# Patient Record
Sex: Female | Born: 1990 | Race: White | Hispanic: No | Marital: Married | Smoking: Never smoker
Health system: Southern US, Community
[De-identification: ages and names within clinical notes are randomized; demographics above are authoritative.]

## PROBLEM LIST (undated history)

## (undated) DIAGNOSIS — K3184 Gastroparesis: Secondary | ICD-10-CM

## (undated) DIAGNOSIS — Z9889 Other specified postprocedural states: Secondary | ICD-10-CM

## (undated) HISTORY — PX: ANTERIOR COMPARTMENT DECOMPRESSION: SHX547

---

## 2014-11-02 DIAGNOSIS — R6881 Early satiety: Secondary | ICD-10-CM | POA: Insufficient documentation

## 2016-01-06 DIAGNOSIS — Z8349 Family history of other endocrine, nutritional and metabolic diseases: Secondary | ICD-10-CM | POA: Insufficient documentation

## 2016-01-06 DIAGNOSIS — K30 Functional dyspepsia: Secondary | ICD-10-CM | POA: Insufficient documentation

## 2016-06-25 ENCOUNTER — Ambulatory Visit
Admission: EM | Admit: 2016-06-25 | Discharge: 2016-06-25 | Disposition: A | Discharge status: Home / Self Care | Payer: BC Managed Care – PPO | Attending: Family Medicine | Admitting: Family Medicine

## 2016-06-25 DIAGNOSIS — R21 Rash and other nonspecific skin eruption: Secondary | ICD-10-CM

## 2016-06-25 HISTORY — DX: Gastroparesis: K31.84

## 2016-06-25 HISTORY — DX: Other specified postprocedural states: Z98.890

## 2016-06-25 MED ORDER — PREDNISONE 10 MG PO TABS: ORAL_TABLET | ORAL | 0 refills | Status: DC

## 2016-06-25 NOTE — Discharge Instructions (Signed)
Take medication as prescribed. Rest. Drink plenty of fluids. Take benadryl at home at discussed.   Follow up with your primary care physician or dermatology this week as needed. Return to Urgent care for new or worsening concerns.

## 2016-06-25 NOTE — ED Triage Notes (Signed)
Patient complains of rash that started yesterday. Patient states that rash originally starte yesterday on her trunk and has now spread to her arms. Patient reports that rash is itchy.

## 2016-06-25 NOTE — ED Provider Notes (Signed)
[facility] ____________________________________________  Time seen: Approximately 4:41 PM  I have reviewed the triage vital signs and the nursing notes.   HISTORY  Chief Complaint Rash  HPI Helen Black is a 25 y.o. female presents for complaints of rash. Reports woke up with small rash area to anterior abdomen yesterday, that has since spread. States rash has spread to torso yesterday, and today has started to spread to tops of arms and legs. Denies known trigger. Denies changes in foods, medications, detergents, lotions or contacts. States no recent sickness, cough, congestion or fevers. States rash is itchy. Denies pain. Denies taking any over the counter medications for the same. Denies other accompanying symptoms. Denies recent insect or tick bites.   PCP: Cleon DewWILLIAMS, MAGGIE ANNA CAMILLE, MD Patient's last menstrual period was 05/14/2016.Reports on 3 month pack oral contraceptive. Denies pregnancy.     Past Medical History:  Diagnosis Date  . Gastroparesis   . History of fasciotomy     There are no active problems to display for this patient.   Past Surgical History:  Procedure Laterality Date  . ANTERIOR COMPARTMENT DECOMPRESSION      Current Outpatient Rx  . Order #: [id number] Class: Historical Med  . Order #: [id number] Class: Normal    No current facility-administered medications for this encounter.   Current Outpatient Prescriptions:  .  norethindrone-ethinyl estradiol-iron (ESTROSTEP FE,TILIA FE,TRI-LEGEST FE) 1-20/1-30/1-35 MG-MCG tablet, Take 1 tablet by mouth daily., Disp: , Rfl:  .  predniSONE (DELTASONE) 10 MG tablet, Start 60 mg po day one, then 50 mg po day two, taper by 10 mg daily until complete., Disp: 21 tablet, Rfl: 0  Allergies Review of patient's allergies indicates no known allergies.  History reviewed. No pertinent family history.  Social History Social History  Substance Use Topics  . Smoking status: Never Smoker  .  Smokeless tobacco: Never Used  . Alcohol use Yes     Comment: occasionally    Review of Systems Constitutional: No fever/chills Eyes: No visual changes. ENT: No sore throat. Cardiovascular: Denies chest pain. Respiratory: Denies shortness of breath. Gastrointestinal: No abdominal pain.  No nausea, no vomiting.  No diarrhea.  No constipation. Genitourinary: Negative for dysuria. Musculoskeletal: Negative for back pain. Skin: Positive for rash. Neurological: Negative for headaches, focal weakness or numbness.  10-point ROS otherwise negative.  ____________________________________________   PHYSICAL EXAM:  VITAL SIGNS: ED Triage Vitals  Enc Vitals Group     BP 06/25/16 1624 115/79     Pulse Rate 06/25/16 1624 97     Resp 06/25/16 1624 16     Temp 06/25/16 1624 98.7 F (37.1 C)     Temp Source 06/25/16 1624 Oral     SpO2 06/25/16 1624 100 %     Weight 06/25/16 1622 125 lb (56.7 kg)     Height 06/25/16 1622 5\' 7"  (1.702 m)     Head Circumference --      Peak Flow --      Pain Score 06/25/16 1623 3     Pain Loc --      Pain Edu? --      Excl. in GC? --     Constitutional: Alert and oriented. Well appearing and in no acute distress. Eyes: Conjunctivae are normal. PERRL. EOMI. ENT      Head: Normocephalic and atraumatic.      Nose: No congestion/rhinnorhea.      Mouth/Throat: Mucous membranes are moist.Oropharynx non-erythematous. Neck: No stridor. Supple without meningismus.  Hematological/Lymphatic/Immunilogical: No cervical  lymphadenopathy. Cardiovascular: Normal rate, regular rhythm. Grossly normal heart sounds.  Good peripheral circulation. Respiratory: Normal respiratory effort without tachypnea nor retractions. Breath sounds are clear and equal bilaterally. No wheezes/rales/rhonchi.. Gastrointestinal: Soft and nontender.  Musculoskeletal:  Nontender with normal range of motion in all extremities.  Neurologic:  Normal speech and language. No gross focal  neurologic deficits are appreciated. Speech is normal. No gait instability.  Skin:  Skin is warm, dry and intact. Except: pruritis erythematous scattered blanchable rash to torso mainly with some rash extending to proximal bilateral upper and lower extremities, no rash to hands or feet or face, nontender, no exudate, no induration, rash in christmas tree distribution bilaterally, no heralds patch noted.  Psychiatric: Mood and affect are normal. Speech and behavior are normal. Patient exhibits appropriate insight and judgment   ___________________________________________   LABS (all labs ordered are listed, but only abnormal results are displayed)  Labs Reviewed - No data to display ____________________________________________   PROCEDURES Procedures    INITIAL IMPRESSION / ASSESSMENT AND PLAN / ED COURSE  Pertinent labs & imaging results that were available during my care of the patient were reviewed by me and considered in my medical decision making (see chart for details).  Well appearing patient. No acute distress. Presents for rash x two days. Patient expresses concern of possibility that she came in contact with something, triggering a rash, but denies known triggers. Rash appearance and distribution appears consistent with pityriasis rosea. Suspect patient may have started with heralds patch to abdomen. As patient concerned for contact trigger, will treat with oral prednisone taper, supportive care, prn benadryl for itching. Discussed regarding pityriasis and supportive care. Discussed indication, risks and benefits of medications with patient.  Discussed follow up with Primary care physician this week. Discussed follow up and return parameters including no resolution or any worsening concerns. Patient verbalized understanding and agreed to plan.   ____________________________________________   FINAL CLINICAL IMPRESSION(S) / ED DIAGNOSES  Final diagnoses:  Rash      Discharge Medication List as of 06/25/2016  5:11 PM    START taking these medications   Details  predniSONE (DELTASONE) 10 MG tablet Start 60 mg po day one, then 50 mg po day two, taper by 10 mg daily until complete., Normal        Note: This dictation was prepared with Dragon dictation along with smaller phrase technology. Any transcriptional errors that result from this process are unintentional.    Clinical Course      Renford Dills, NP 06/26/16 [phone]

## 2016-06-27 ENCOUNTER — Telehealth: Payer: Self-pay

## 2016-06-27 NOTE — Telephone Encounter (Signed)
Courtesy call back completed today for patients recent visit at [facility]. Patient did not answer, left message on voicemail to call back with any questions or concerns.   

## 2018-01-10 LAB — HM PAP SMEAR: HM Pap smear: NEGATIVE

## 2018-02-27 ENCOUNTER — Ambulatory Visit (INDEPENDENT_AMBULATORY_CARE_PROVIDER_SITE_OTHER): Payer: BC Managed Care – PPO | Admitting: Obstetrics and Gynecology

## 2018-02-27 ENCOUNTER — Encounter: Payer: Self-pay | Admitting: Obstetrics and Gynecology

## 2018-02-27 VITALS — BP 122/76 | Ht 67.0 in | Wt 127.0 lb

## 2018-02-27 DIAGNOSIS — N970 Female infertility associated with anovulation: Secondary | ICD-10-CM | POA: Insufficient documentation

## 2018-02-27 DIAGNOSIS — N912 Amenorrhea, unspecified: Secondary | ICD-10-CM

## 2018-02-27 LAB — POCT URINE PREGNANCY: Preg Test, Ur: NEGATIVE

## 2018-02-27 MED ORDER — MEDROXYPROGESTERONE ACETATE 10 MG PO TABS: 10.0000 mg | ORAL_TABLET | Freq: Every day | ORAL | 0 refills | Status: DC

## 2018-02-27 NOTE — Progress Notes (Signed)
Obstetrics & Gynecology Office Visit   Chief Complaint  Patient presents with  . Amenorrhea    Stopped OCP in 06/2017, no cycle, negative pregnancy test   History of Present Illness: 27 y.o. G0P0000 female who had been on contraception for three years. She stopped taking the birth control in October. She had no menses since that time.  She did take a 10-day course of provera at the beginning of May and did have a five-day withdrawal bleed from that. She was on Microgestin 1.5/30.  Prior to being on contraception she had a menses each month, lasting about 7-8 days.  She had not been on contraception prior to 2015. She started having withdrawal bleeding on 01/17/18.  She took a test every day for weeks starting at about day 8 of her cycle.  She is now actively trying to conceive and is taking PNVs.     Last pap smear was in early May and it was normal.  Denies history of STDs.   She has a history of gastroparesis.  She saw a nutritionist and changed her diet.  This was an unknown cause.  She initially dropped weight.  She is back to nearly her original weight.  Gastroparesis was diagnosed about 2 years ago.  It seems to be under good control nutritionally.   She does not exercise like she did in the past. She walks every day.   She had a workup in February. Normal FSH, Prolactin, estradiol, normal thyroid studies in January. She has had multiple negative pregnancy tests, including today.   Past Medical History:  Diagnosis Date  . Gastroparesis   . History of fasciotomy     Past Surgical History:  Procedure Laterality Date  . ANTERIOR COMPARTMENT DECOMPRESSION      Gynecologic History: Patient's last menstrual period was 07/01/2017 (approximate).  Obstetric History: G0P0000  Family History: Sister with thyroid disease. No gynecologic cancer.     Social History   Socioeconomic History  . Marital status: Married    Spouse name: Not on file  . Number of children: Not on file  .  Years of education: Not on file  . Highest education level: Not on file  Occupational History  . Not on file  Social Needs  . Financial resource strain: Not on file  . Food insecurity:    Worry: Not on file    Inability: Not on file  . Transportation needs:    Medical: Not on file    Non-medical: Not on file  Tobacco Use  . Smoking status: Never Smoker  . Smokeless tobacco: Never Used  Substance and Sexual Activity  . Alcohol use: Yes    Comment: occasionally  . Drug use: No  . Sexual activity: Yes  Lifestyle  . Physical activity:    Days per week: Not on file    Minutes per session: Not on file  . Stress: Not on file  Relationships  . Social connections:    Talks on phone: Not on file    Gets together: Not on file    Attends religious service: Not on file    Active member of club or organization: Not on file    Attends meetings of clubs or organizations: Not on file    Relationship status: Not on file  . Intimate partner violence:    Fear of current or ex partner: Not on file    Emotionally abused: Not on file    Physically abused: Not on file  Forced sexual activity: Not on file  Other Topics Concern  . Not on file  Social History Narrative  . Not on file   Allergies: No Known Allergies  Prior to Admission medications   PNV   Review of Systems  Constitutional: Negative.   HENT: Negative.   Eyes: Negative.   Respiratory: Negative.   Cardiovascular: Negative.   Gastrointestinal: Negative.   Genitourinary: Negative.   Musculoskeletal: Negative.   Skin: Negative.   Neurological: Negative.   Psychiatric/Behavioral: Negative.      Physical Exam BP 122/76 (BP Location: Left Arm, Patient Position: Sitting, Cuff Size: Normal)   Ht 5\' 7"  (1.702 m)   Wt 127 lb (57.6 kg)   LMP 07/01/2017 (Approximate)   BMI 19.89 kg/m  Patient's last menstrual period was 07/01/2017 (approximate). Physical Exam  Constitutional: She is oriented to person, place, and time.  She appears well-developed and well-nourished. No distress.  HENT:  Head: Normocephalic and atraumatic.  Eyes: EOM are normal. No scleral icterus.  Neck: Normal range of motion. Neck supple. No thyromegaly present.  Cardiovascular: Normal rate and regular rhythm. Exam reveals no gallop and no friction rub.  No murmur heard. Pulmonary/Chest: Effort normal and breath sounds normal. No respiratory distress. She has no wheezes. She has no rales.  Abdominal: Soft. Bowel sounds are normal. She exhibits no distension and no mass. There is no tenderness. There is no rebound and no guarding.  Musculoskeletal: Normal range of motion. She exhibits no edema.  Lymphadenopathy:    She has no cervical adenopathy.  Neurological: She is alert and oriented to person, place, and time. No cranial nerve deficit.  Skin: Skin is warm and dry. No erythema.  Psychiatric: She has a normal mood and affect. Her behavior is normal. Judgment normal.   Lab Results  Component Value Date   PREGTESTUR Negative 02/27/2018   Assessment: 27 y.o. 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 Amenorrhea      Plan: Problem List Items Addressed This Visit      Genitourinary   Female infertility associated with anovulation - Primary   Relevant Medications   medroxyPROGESTERone (PROVERA) 10 MG tablet     Other   Amenorrhea   Relevant Medications   medroxyPROGESTERone (PROVERA) 10 MG tablet   Other Relevant Orders   POCT urine pregnancy (Completed)     Based on her workup of secondary amenorrhea, she appears to be anovulatory.  Given that [facility] early cycle, through mid-cycle, into late cycle, she likely tested during the window in which she would have ovulated and had [facility]. We discussed anovulation and its causes. She has no laboratory indicators of hyperandrogenemia.  Discussed ovulation induction, given her desire to conceive.  Discussed clomid versus  letrozole.  Discussed the risks and benefits of each. She would like to try letrozole to see if we can induce ovulation.  Will check day-21 progesterone levels.    30 minutes spent in face to face discussion with > 50% spent in counseling,management, and coordination of care of her anovulatory cycles and infertility.   Thomasene MohairStephen Jalien Weakland, MD 02/27/2018 5:37 PM

## 2018-03-03 ENCOUNTER — Encounter: Payer: Self-pay | Admitting: Obstetrics and Gynecology

## 2018-03-03 NOTE — Telephone Encounter (Signed)
This is your patient.

## 2018-03-13 ENCOUNTER — Encounter: Payer: Self-pay | Admitting: Obstetrics and Gynecology

## 2018-03-15 ENCOUNTER — Encounter: Payer: Self-pay | Admitting: Obstetrics and Gynecology

## 2018-03-17 ENCOUNTER — Other Ambulatory Visit: Payer: Self-pay | Admitting: Obstetrics and Gynecology

## 2018-03-17 DIAGNOSIS — N970 Female infertility associated with anovulation: Secondary | ICD-10-CM

## 2018-03-17 MED ORDER — LETROZOLE 2.5 MG PO TABS: 5.0000 mg | ORAL_TABLET | Freq: Every day | ORAL | 0 refills | Status: DC

## 2018-04-07 ENCOUNTER — Other Ambulatory Visit: Payer: BC Managed Care – PPO

## 2018-04-07 DIAGNOSIS — N970 Female infertility associated with anovulation: Secondary | ICD-10-CM

## 2018-04-08 ENCOUNTER — Encounter: Payer: Self-pay | Admitting: Obstetrics and Gynecology

## 2018-04-08 LAB — PROGESTERONE: PROGESTERONE: 27.8 ng/mL

## 2018-04-13 ENCOUNTER — Encounter: Payer: Self-pay | Admitting: Obstetrics and Gynecology

## 2018-04-14 ENCOUNTER — Other Ambulatory Visit: Payer: Self-pay | Admitting: Obstetrics and Gynecology

## 2018-04-14 DIAGNOSIS — N970 Female infertility associated with anovulation: Secondary | ICD-10-CM

## 2018-04-14 MED ORDER — LETROZOLE 2.5 MG PO TABS: 5.0000 mg | ORAL_TABLET | Freq: Every day | ORAL | 0 refills | Status: AC

## 2018-04-14 NOTE — Telephone Encounter (Signed)
Called in repeat letrozole not sure what cycle number this is for her

## 2018-04-21 ENCOUNTER — Other Ambulatory Visit: Payer: Self-pay | Admitting: Obstetrics and Gynecology

## 2018-04-21 DIAGNOSIS — N970 Female infertility associated with anovulation: Secondary | ICD-10-CM

## 2018-05-06 ENCOUNTER — Other Ambulatory Visit: Payer: BC Managed Care – PPO

## 2018-05-06 DIAGNOSIS — N970 Female infertility associated with anovulation: Secondary | ICD-10-CM

## 2018-05-07 LAB — PROGESTERONE: Progesterone: 23.3 ng/mL

## 2018-05-15 ENCOUNTER — Other Ambulatory Visit: Payer: Self-pay | Admitting: Obstetrics and Gynecology

## 2018-05-15 DIAGNOSIS — N970 Female infertility associated with anovulation: Secondary | ICD-10-CM

## 2018-05-15 MED ORDER — LETROZOLE 2.5 MG PO TABS: 5.0000 mg | ORAL_TABLET | Freq: Every day | ORAL | 0 refills | Status: AC

## 2018-06-19 ENCOUNTER — Other Ambulatory Visit: Payer: Self-pay | Admitting: Obstetrics and Gynecology

## 2018-06-19 DIAGNOSIS — N970 Female infertility associated with anovulation: Secondary | ICD-10-CM

## 2018-06-19 MED ORDER — LETROZOLE 2.5 MG PO TABS: 7.5000 mg | ORAL_TABLET | Freq: Every day | ORAL | 0 refills | Status: AC

## 2018-07-28 ENCOUNTER — Other Ambulatory Visit (HOSPITAL_COMMUNITY)
Admission: RE | Admit: 2018-07-28 | Discharge: 2018-07-28 | Disposition: A | Discharge status: Home / Self Care | Payer: BC Managed Care – PPO | Source: Ambulatory Visit | Attending: Obstetrics and Gynecology | Admitting: Obstetrics and Gynecology

## 2018-07-28 ENCOUNTER — Encounter: Payer: Self-pay | Admitting: Obstetrics and Gynecology

## 2018-07-28 ENCOUNTER — Ambulatory Visit (INDEPENDENT_AMBULATORY_CARE_PROVIDER_SITE_OTHER): Payer: BC Managed Care – PPO | Admitting: Obstetrics and Gynecology

## 2018-07-28 VITALS — BP 114/70 | Wt 125.0 lb

## 2018-07-28 DIAGNOSIS — Z113 Encounter for screening for infections with a predominantly sexual mode of transmission: Secondary | ICD-10-CM | POA: Insufficient documentation

## 2018-07-28 DIAGNOSIS — Z3401 Encounter for supervision of normal first pregnancy, first trimester: Secondary | ICD-10-CM

## 2018-07-28 DIAGNOSIS — Z3A01 Less than 8 weeks gestation of pregnancy: Secondary | ICD-10-CM

## 2018-07-28 DIAGNOSIS — Z3400 Encounter for supervision of normal first pregnancy, unspecified trimester: Secondary | ICD-10-CM | POA: Insufficient documentation

## 2018-07-28 NOTE — Progress Notes (Signed)
New Obstetric Patient H&P   Chief Complaint: "Desires prenatal care"  History of Present Illness: Patient is a 27 y.o. G1P0000 Not Hispanic or Latino female, LMP 06/19/2018 presents with amenorrhea and positive home pregnancy test. Based on her  LMP, her EDD is Estimated Date of Delivery: 03/26/19 and her EGA is [redacted]w[redacted]d. Cycles are 7. days, regular, and occur approximately every : 33 days. Her last pap smear was 7 months ago and was no abnormalities.    She had a urine pregnancy test which was positive 10 day(s)  ago. Her last menstrual period was normal and lasted for  6 or 7 day(s). Since her LMP she claims she has experienced no issues. She denies vaginal bleeding. Her past medical history is notable for gastroparesis.   Since her LMP, she admits to the use of tobacco products  no She claims she has gained zero pounds since the start of her pregnancy.  There are cats in the home in the home  no  She admits close contact with children on a regular basis  yes  She has had chicken pox in the past yes She has had Tuberculosis exposures, symptoms, or previously tested positive for TB   no Current or past history of domestic violence. no  Genetic Screening/Teratology Counseling: (Includes patient, baby's father, or anyone in either family with:)   1. Patient's age >/= 33 at [facility]  no 2. Thalassemia ([place], [place], Mediterranean, or Asian background): MCV<80  no 3. Neural tube defect (meningomyelocele, spina bifida, anencephaly)  no 4. Congenital heart defect  no  5. Down syndrome  no 6. Tay-Sachs (Jewish, [place]  no 7. Canavan's Disease  no 8. Sickle cell disease or trait (African)  no  9. Hemophilia or other blood disorders  no  10. Muscular dystrophy  no  11. Cystic fibrosis  no  12. Huntington's Chorea  no  13. Mental retardation/autism  no 14. Other inherited genetic or chromosomal disorder  no 15. Maternal metabolic disorder (DM, PKU, etc)  no 16. Patient or FOB with a child  with a birth defect not listed above no  16a. Patient or FOB with a birth defect themselves no 17. Recurrent pregnancy loss, or stillbirth  no  18. Any medications since LMP other than prenatal vitamins (include vitamins, supplements, OTC meds, drugs, alcohol)  Letrozole, miralax, PNV 19. Any other genetic/environmental exposure to discuss  no  Infection History:   1. Lives with someone with TB or TB exposed  no  2. Patient or partner has history of genital herpes  no 3. Rash or viral illness since LMP  no 4. History of STI (GC, CT, HPV, syphilis, HIV)  no 5. History of recent travel :  no  Other pertinent information:  History of gastroparesis.  Pregnancy conceived on letrozole    Review of Systems:10 point review of systems negative unless otherwise noted in HPI  Past Medical History:  Diagnosis Date  . Gastroparesis   . History of fasciotomy     Past Surgical History:  Procedure Laterality Date  . ANTERIOR COMPARTMENT DECOMPRESSION      Gynecologic History: Patient's last menstrual period was 06/19/2018.  Obstetric History: G1P0000  Family History  Problem Relation Age of Onset  . Hypothyroidism Sister     Social History   Socioeconomic History  . Marital status: Married    Spouse name: Not on file  . Number of children: Not on file  . Years of education: Not on file  .  Highest education level: Not on file  Occupational History  . Not on file  Social Needs  . Financial resource strain: Not on file  . Food insecurity:    Worry: Not on file    Inability: Not on file  . Transportation needs:    Medical: Not on file    Non-medical: Not on file  Tobacco Use  . Smoking status: Never Smoker  . Smokeless tobacco: Never Used  Substance and Sexual Activity  . Alcohol use: Yes    Comment: occasionally  . Drug use: No  . Sexual activity: Yes  Lifestyle  . Physical activity:    Days per week: Not on file    Minutes per session: Not on file  . Stress: Not on  file  Relationships  . Social connections:    Talks on phone: Not on file    Gets together: Not on file    Attends religious service: Not on file    Active member of club or organization: Not on file    Attends meetings of clubs or organizations: Not on file    Relationship status: Not on file  . Intimate partner violence:    Fear of current or ex partner: Not on file    Emotionally abused: Not on file    Physically abused: Not on file    Forced sexual activity: Not on file  Other Topics Concern  . Not on file  Social History Narrative  . Not on file    No Known Allergies  Prior to Admission medications   Medication Sig Start Date End Date Taking? Authorizing Provider  PRENATAL 28-0.8 MG TABS Take by mouth.   Yes [provider]  medroxyPROGESTERone (PROVERA) 10 MG tablet Take 1 tablet (10 mg total) by mouth daily for 10 days. 02/27/18 03/09/18  Conard Novak, MD    Physical Exam BP 114/70   Wt 125 lb (56.7 kg)   LMP 06/19/2018   BMI 19.58 kg/m   Physical Exam  Constitutional: She is oriented to person, place, and time. She appears well-developed and well-nourished. No distress.  HENT:  Head: Normocephalic and atraumatic.  Eyes: Conjunctivae are normal. No scleral icterus.  Neck: Normal range of motion. Neck supple. No thyromegaly present.  Cardiovascular: Normal rate and regular rhythm. Exam reveals no gallop and no friction rub.  No murmur heard. Pulmonary/Chest: Effort normal and breath sounds normal. No respiratory distress. She has no wheezes. She has no rales.  Abdominal: Soft. Bowel sounds are normal. She exhibits no distension and no mass. There is no tenderness. There is no rebound and no guarding.  Genitourinary: Vagina normal. Pelvic exam was performed with patient supine. There is no rash, tenderness, lesion or injury on the right labia. There is no rash, tenderness, lesion or injury on the left labia. Uterus is enlarged. Uterus is not deviated, not  fixed and not tender. Cervix exhibits no motion tenderness, no discharge and no friability. Right adnexum displays no mass, no tenderness and no fullness. Left adnexum displays no mass, no tenderness and no fullness. No erythema or bleeding in the vagina. No signs of injury around the vagina. No vaginal discharge found.  Musculoskeletal: Normal range of motion. She exhibits no edema.  Lymphadenopathy:    She has no cervical adenopathy.  Neurological: She is alert and oriented to person, place, and time. No cranial nerve deficit.  Skin: Skin is warm and dry. No erythema.  Psychiatric: She has a normal mood and affect. Her  behavior is normal. Judgment normal.     Female Chaperone present during breast and/or pelvic exam.  Assessment: 27 y.o. G1P0000 at [redacted]w[redacted]d presenting to initiate prenatal care  Plan: 1) Avoid alcoholic beverages. 2) Patient encouraged not to smoke.  3) Discontinue the use of all non-medicinal drugs and chemicals.  4) Take prenatal vitamins daily.  5) Nutrition, food safety (fish, cheese advisories, and high nitrite foods) and exercise discussed. 6) Hospital and practice style discussed with cross coverage system.  7) Genetic Screening, such as with 1st Trimester Screening, cell free fetal DNA, AFP testing, and Ultrasound, as well as with amniocentesis and CVS as appropriate, is discussed with patient. At the conclusion of today's visit patient undecided genetic testing 8) Patient is asked about travel to areas at risk for the [place] virus, and counseled to avoid travel and exposure to mosquitoes or sexual partners who may have themselves been exposed to the virus. Testing is discussed, and will be ordered as appropriate.   Thomasene Mohair, MD 07/28/2018 10:34 AM

## 2018-07-29 LAB — RPR+RH+ABO+RUB AB+AB SCR+CB...
Antibody Screen: NEGATIVE
HEMOGLOBIN: 15 g/dL (ref 11.1–15.9)
HIV SCREEN 4TH GENERATION: NONREACTIVE
Hematocrit: 43.9 % (ref 34.0–46.6)
Hepatitis B Surface Ag: NEGATIVE
MCH: 30.1 pg (ref 26.6–33.0)
MCHC: 34.2 g/dL (ref 31.5–35.7)
MCV: 88 fL (ref 79–97)
PLATELETS: 303 10*3/uL (ref 150–450)
RBC: 4.99 x10E6/uL (ref 3.77–5.28)
RDW: 12.4 % (ref 12.3–15.4)
RPR Ser Ql: NONREACTIVE
RUBELLA: 4.59 {index} (ref >0.99)
Rh Factor: POSITIVE
VARICELLA: 2133 {index} (ref >165)
WBC: 4.9 10*3/uL (ref 3.4–10.8)

## 2018-07-29 LAB — URINE DRUG PANEL 7
Amphetamines, Urine: NEGATIVE ng/mL
BENZODIAZEPINE QUANT UR: NEGATIVE ng/mL
Barbiturate Quant, Ur: NEGATIVE ng/mL
CANNABINOID QUANT UR: NEGATIVE ng/mL
COCAINE (METAB.): NEGATIVE ng/mL
Opiate Quant, Ur: NEGATIVE ng/mL
PCP Quant, Ur: NEGATIVE ng/mL

## 2018-07-29 LAB — BETA HCG QUANT (REF LAB): HCG QUANT: 3822 m[IU]/mL

## 2018-07-30 LAB — URINE CULTURE

## 2018-07-30 LAB — CERVICOVAGINAL ANCILLARY ONLY
Chlamydia: NEGATIVE
Neisseria Gonorrhea: NEGATIVE

## 2018-08-12 ENCOUNTER — Ambulatory Visit (INDEPENDENT_AMBULATORY_CARE_PROVIDER_SITE_OTHER): Payer: BC Managed Care – PPO

## 2018-08-12 ENCOUNTER — Ambulatory Visit (INDEPENDENT_AMBULATORY_CARE_PROVIDER_SITE_OTHER): Payer: BC Managed Care – PPO | Admitting: Obstetrics and Gynecology

## 2018-08-12 ENCOUNTER — Encounter: Payer: Self-pay | Admitting: Obstetrics and Gynecology

## 2018-08-12 VITALS — BP 106/62 | Wt 124.0 lb

## 2018-08-12 DIAGNOSIS — N8311 Corpus luteum cyst of right ovary: Secondary | ICD-10-CM | POA: Diagnosis not present

## 2018-08-12 DIAGNOSIS — Z3401 Encounter for supervision of normal first pregnancy, first trimester: Secondary | ICD-10-CM

## 2018-08-12 DIAGNOSIS — Z3A01 Less than 8 weeks gestation of pregnancy: Secondary | ICD-10-CM | POA: Diagnosis not present

## 2018-08-12 DIAGNOSIS — O3411 Maternal care for benign tumor of corpus uteri, first trimester: Secondary | ICD-10-CM | POA: Diagnosis not present

## 2018-08-12 DIAGNOSIS — O219 Vomiting of pregnancy, unspecified: Secondary | ICD-10-CM

## 2018-08-12 LAB — POCT URINALYSIS DIPSTICK OB: GLUCOSE, UA: NEGATIVE

## 2018-08-12 MED ORDER — DOXYLAMINE-PYRIDOXINE ER 20-20 MG PO TBCR
1.0000 | EXTENDED_RELEASE_TABLET | Freq: Two times a day (BID) | ORAL | 11 refills | Status: AC
Start: 2018-08-12 — End: 2018-10-11

## 2018-08-12 NOTE — Progress Notes (Signed)
ROB/US C/O nausea

## 2018-08-12 NOTE — Progress Notes (Signed)
Routine Prenatal Care Visit  Subjective  Helen Black is a 27 y.o. G1P0000 at [redacted]w[redacted]d being seen today for ongoing prenatal care.  She is currently monitored for the following issues for this low-risk pregnancy and has Female infertility associated with anovulation; Amenorrhea; Delayed gastric emptying; Early satiety; Family history of thyroid disease in sister; and Supervision of normal first pregnancy on their problem list.  ----------------------------------------------------------------------------------- Patient reports nausea.    . Vag. Bleeding: None.   . Denies leaking of fluid.  ----------------------------------------------------------------------------------- The following portions of the patient's history were reviewed and updated as appropriate: allergies, current medications, past family history, past medical history, past social history, past surgical history and problem list. Problem list updated.   Objective  Blood pressure 106/62, weight 124 lb (56.2 kg), last menstrual period 06/19/2018. Pregravid weight 125 lb (56.7 kg) Total Weight Gain -1 lb (-0.454 kg) Urinalysis:      Fetal Status: Fetal Heart Rate (bpm): 141         General:  Alert, oriented and cooperative. Patient is in no acute distress.  Skin: Skin is warm and dry. No rash noted.   Cardiovascular: Normal heart rate noted  Respiratory: Normal respiratory effort, no problems with respiration noted  Abdomen: Soft, gravid, appropriate for gestational age. Pain/Pressure: Absent     Pelvic:  Cervical exam deferred        Extremities: Normal range of motion.     Mental Status: Normal mood and affect. Normal behavior. Normal judgment and thought content.     Assessment   27 y.o. G1P0000 at [redacted]w[redacted]d by  03/30/2019, by Ultrasound presenting for routine prenatal visit  Plan   pregnancy Problems (from 07/28/18 to present)    Problem Noted Resolved   Supervision of normal first pregnancy 07/28/2018 by Conard Novak, MD No   Overview Addendum 08/12/2018  5:20 PM by Natale Milch, MD      Clinic [facility] Prenatal Labs  Dating  7 wk [place] Blood type: B/Positive/-- (11/11 1127)   Genetic Screen 1 Screen:     AFP:      Quad:      NIPS:    Antibody:Negative (11/11 1127)  Anatomic [place]  Rubella: 4.59 (11/11 1127)  Varicella: Immune  GTT Early:        28 wk:      RPR: Non Reactive (11/11 1127)   Rhogam  not needed HBsAg: Negative (11/11 1127)   TDaP vaccine                       HIV: Non Reactive (11/11 1127)   Flu Shot                                GBS:   Contraception  Pap: 2019 NIL  CBB     CS/VBAC    Baby Food    Support Person                 Gestational age appropriate obstetric precautions including but not limited to vaginal bleeding, contractions, leaking of fluid and fetal movement were reviewed in detail with the patient.    Prescription for bonjesta sent. Discussed B6 and unisom as an alternative. Discussed small frequent meals. Discussed BRAT diet. Discussed ginger.   Will date pregnancy off of [place]. Patient reports ovulation and conception on 07/07/18. She reports 35 day cycle length usually.   Discussed  options for genetic screening for the pregnancy with Maternit21 vs First trimester screening.    Return in about 3 weeks (around 09/02/2018) for ROB.  Natale Milchhristanna R Lisle Skillman MD [facility], [facility] 08/12/2018, 5:22 PM

## 2018-09-05 ENCOUNTER — Encounter: Payer: Self-pay | Admitting: Obstetrics and Gynecology

## 2018-09-05 ENCOUNTER — Ambulatory Visit (INDEPENDENT_AMBULATORY_CARE_PROVIDER_SITE_OTHER): Payer: BC Managed Care – PPO | Admitting: Obstetrics and Gynecology

## 2018-09-05 VITALS — BP 114/70 | Wt 127.0 lb

## 2018-09-05 DIAGNOSIS — Z3401 Encounter for supervision of normal first pregnancy, first trimester: Secondary | ICD-10-CM

## 2018-09-05 DIAGNOSIS — Z1379 Encounter for other screening for genetic and chromosomal anomalies: Secondary | ICD-10-CM

## 2018-09-05 DIAGNOSIS — Z3A10 10 weeks gestation of pregnancy: Secondary | ICD-10-CM

## 2018-09-05 NOTE — Progress Notes (Signed)
Routine Prenatal Care Visit  Subjective  Helen Black is a 27 y.o. G1P0000 at 6042w4d being seen today for ongoing prenatal care.  She is currently monitored for the following issues for this low-risk pregnancy and has Female infertility associated with anovulation; Amenorrhea; Delayed gastric emptying; Early satiety; Family history of thyroid disease in sister; and Supervision of normal first pregnancy on their problem list.  ----------------------------------------------------------------------------------- Patient reports no complaints.    . Vag. Bleeding: None.   . Denies leaking of fluid.  ----------------------------------------------------------------------------------- The following portions of the patient's history were reviewed and updated as appropriate: allergies, current medications, past family history, past medical history, past social history, past surgical history and problem list. Problem list updated.   Objective  Blood pressure 114/70, weight 127 lb (57.6 kg), last menstrual period 06/19/2018. Pregravid weight 125 lb (56.7 kg) Total Weight Gain 2 lb (0.907 kg) Urinalysis: Urine Protein    Urine Glucose    Fetal Status: Fetal Heart Rate (bpm): 170         General:  Alert, oriented and cooperative. Patient is in no acute distress.  Skin: Skin is warm and dry. No rash noted.   Cardiovascular: Normal heart rate noted  Respiratory: Normal respiratory effort, no problems with respiration noted  Abdomen: Soft, gravid, appropriate for gestational age. Pain/Pressure: Absent     Pelvic:  Cervical exam deferred        Extremities: Normal range of motion.     Mental Status: Normal mood and affect. Normal behavior. Normal judgment and thought content.   Assessment   27 y.o. G1P0000 at 1042w4d by  03/30/2019, by Ultrasound presenting for routine prenatal visit  Plan   pregnancy Problems (from 07/28/18 to present)    Problem Noted Resolved   Supervision of normal first  pregnancy 07/28/2018 by Conard NovakJackson, Stephen D, MD No   Overview Addendum 08/12/2018  5:20 PM by Natale MilchSchuman, Christanna R, MD      Clinic [facility] Prenatal Labs  Dating  7 wk US Blood type: B/Positive/-- (11/11 1127)   Genetic Screen 1 Screen:     AFP:      Quad:      NIPS:    Antibody:Negative (11/11 1127)  Anatomic US  Rubella: 4.59 (11/11 1127)  Varicella: Immune  GTT Early:        28 wk:      RPR: Non Reactive (11/11 1127)   Rhogam  not needed HBsAg: Negative (11/11 1127)   TDaP vaccine                       HIV: Non Reactive (11/11 1127)   Flu Shot                                GBS:   Contraception  Pap: 2019 NIL  CBB     CS/VBAC    Baby Food    Support Person                 Preterm labor symptoms and general obstetric precautions including but not limited to vaginal bleeding, contractions, leaking of fluid and fetal movement were reviewed in detail with the patient. Please refer to After Visit Summary for other counseling recommendations.   Return in about 1 week (around 09/12/2018) for U/S for NT and labs(NT, carrier screening), routine prenatal.  Thomasene MohairStephen Jackson, MD, Merlinda FrederickFACOG [facility], [facility] 09/05/2018 4:33 PM

## 2018-09-15 ENCOUNTER — Ambulatory Visit (INDEPENDENT_AMBULATORY_CARE_PROVIDER_SITE_OTHER): Payer: BC Managed Care – PPO | Admitting: Obstetrics & Gynecology

## 2018-09-15 ENCOUNTER — Ambulatory Visit (INDEPENDENT_AMBULATORY_CARE_PROVIDER_SITE_OTHER): Payer: BC Managed Care – PPO

## 2018-09-15 VITALS — BP 90/60 | Wt 123.0 lb

## 2018-09-15 DIAGNOSIS — Z3A12 12 weeks gestation of pregnancy: Secondary | ICD-10-CM

## 2018-09-15 DIAGNOSIS — Z3682 Encounter for antenatal screening for nuchal translucency: Secondary | ICD-10-CM

## 2018-09-15 DIAGNOSIS — Z3401 Encounter for supervision of normal first pregnancy, first trimester: Secondary | ICD-10-CM

## 2018-09-15 DIAGNOSIS — Z1379 Encounter for other screening for genetic and chromosomal anomalies: Secondary | ICD-10-CM

## 2018-09-15 LAB — POCT URINALYSIS DIPSTICK OB
GLUCOSE, UA: NEGATIVE
POC,PROTEIN,UA: NEGATIVE

## 2018-09-15 NOTE — Patient Instructions (Signed)

## 2018-09-15 NOTE — Addendum Note (Signed)
Addended by: Cornelius MorasPATTERSON, Chey Cho D on: 09/15/2018 02:14 PM   Modules accepted: Orders

## 2018-09-15 NOTE — Progress Notes (Signed)
  HPI: Min nausea, no pain.  Pt is [redacted] weeks pregnant  Ultrasound demonstrates - viable IUP, singleton, 12 weeks, see below These findings are also for NT  PMHx: She  has a past medical history of Gastroparesis and History of fasciotomy. Also,  has a past surgical history that includes Anterior compartment decompression., family history includes Hypothyroidism in her sister.,  reports that she has never smoked. She has never used smokeless tobacco. She reports current alcohol use. She reports that she does not use drugs.  She has a current medication list which includes the following prescription(s): doxylamine-pyridoxine er and prenatal. Also, has No Known Allergies.  Review of Systems  All other systems reviewed and are negative.   Objective: BP 90/60   Wt 123 lb (55.8 kg)   LMP 06/19/2018 (Approximate)   BMI 19.26 kg/m   Physical examination Constitutional NAD, Conversant  Skin No rashes, lesions or ulceration.   Extremities: Moves all appropriately.  Normal ROM for age. No lymphadenopathy.  Neuro: Grossly intact  Psych: Oriented to PPT.  Normal mood. Normal affect.   Us Fetal Nuchal Translucency Measurement  Result Date: 09/15/2018 Patient Name: Helen Black DOB: 03/15/1991 MRN: [id number] ULTRASOUND REPORT Location: [facility] Date of Service: 09/15/2018 Indications:First Trimester Screen - NT Findings: Singleton intrauterine pregnancy is visualized with a CRL consistent with [redacted]w[redacted]d  gestation, giving an (U/S) EDD of 03/28/19. The (U/S) EDD is consistent with the clinically established Estimated Date of Delivery: 03/30/19. FHR: 156 BPM CRL measurement: 57.2 mm NT measurement: 1.4 mm. Yolk sac [id number] [facility] [address] [id number] LLCy [phone] [id number] Goldsmi Paul Mayjor Ager, MD, FACOG [facility], [facility] 09/15/2018  1:21 PM   Assessment:  [redacted] weeks gestation of pregnancy  Encounter for supervision of normal first pregnancy in first trimester  Genetic screening - Plan: First Trimester Screen w/NT, Inheritest Society Guided  Paul Anastacio Bua, MD, FACOG [facility], [facility] 09/15/2018  1:33 PM

## 2018-09-17 NOTE — L&D Delivery Note (Signed)
Obstetrical Delivery Note   Date of Delivery:   04/02/2019 Primary OB:   [facility] Gestational Age/EDD: [redacted]w[redacted]d (Dated by 7wk1d [place]) Antepartum complications: none  Delivered By:   Dalia Heading, CNM  Delivery Type:   spontaneous vaginal delivery  Procedure Details:   Mother pushed to deliver a vigorous female infant in OA with a very short cord. Baby dried and suctioned and placed on mother's lower abdomen. Cord was clamped after a 2 minute delay and then cut by FOB. Baby then placed skin to skin with mother. Spontaneous delivery of intact placenta and 3 vessel cord. There was a #A perineal laceration involving a Y shaped laceration in the vagina. The rectal capsule was reinforced with 2-0 Vicryl and the second degree laceration was repaired with 3-0 and 2-0 Chromic.One stitch was placed in a right periurethral laceration. EBL 500 ml, but measured blood loss was 292ml.  Anesthesia:    epidural Intrapartum complications: 3A perineal laceration GBS:    negative Laceration:    3rd degree perineal (essentially only involving the capsule of the sphincter) Episiotomy:    none Placenta:    Via active 3rd stage. To pathology: no Estimated Blood Loss:  500 ml Baby:    Liveborn female, Apgars 9/9, weight pending    Dalia Heading, CNM

## 2018-09-18 ENCOUNTER — Telehealth: Payer: Self-pay

## 2018-09-18 LAB — FIRST TRIMESTER SCREEN W/NT
CRL: 57.2 mm
DIA MoM: 0.59
DIA Value: 167.3 pg/mL
GEST AGE-COLLECT: 12.1 wk
MATERNAL AGE AT EDD: 28.6 a
NUCHAL TRANSLUCENCY MOM: 1.03
Nuchal Translucency: 1.4 mm
Number of Fetuses: 1
PAPP-A MOM: 1.24
PAPP-A Value: 1321.1 ng/mL
TEST RESULTS: NEGATIVE
WEIGHT: 123 [lb_av]
hCG MoM: 1.01
hCG Value: 111.9 IU/mL

## 2018-09-18 NOTE — Telephone Encounter (Signed)
Judeth Cornfield from [employer] calling for sonographers ID for 09/15/18 u/s.  [phone] opt 2,3.  Information given:  Liane Comber,  [place] # [phone]

## 2018-09-29 LAB — INHERITEST SOCIETY GUIDED

## 2018-10-13 ENCOUNTER — Encounter: Payer: Self-pay | Admitting: Obstetrics and Gynecology

## 2018-10-13 ENCOUNTER — Ambulatory Visit (INDEPENDENT_AMBULATORY_CARE_PROVIDER_SITE_OTHER): Payer: BC Managed Care – PPO | Admitting: Obstetrics and Gynecology

## 2018-10-13 VITALS — BP 110/70 | Wt 130.0 lb

## 2018-10-13 DIAGNOSIS — Z3402 Encounter for supervision of normal first pregnancy, second trimester: Secondary | ICD-10-CM

## 2018-10-13 DIAGNOSIS — Z3A16 16 weeks gestation of pregnancy: Secondary | ICD-10-CM

## 2018-10-13 NOTE — Progress Notes (Signed)
Routine Prenatal Care Visit  Subjective  Helen Black is a 28 y.o. G1P0000 at [redacted]w[redacted]d being seen today for ongoing prenatal care.  She is currently monitored for the following issues for this low-risk pregnancy and has Female infertility associated with anovulation; Amenorrhea; Delayed gastric emptying; Early satiety; Family history of thyroid disease in sister; and Supervision of normal first pregnancy on their problem list.  ----------------------------------------------------------------------------------- Patient reports no complaints.    . Vag. Bleeding: None.  Movement: Absent. Denies leaking of fluid.  ----------------------------------------------------------------------------------- The following portions of the patient's history were reviewed and updated as appropriate: allergies, current medications, past family history, past medical history, past social history, past surgical history and problem list. Problem list updated.   Objective  Blood pressure 110/70, weight 130 lb (59 kg), last menstrual period 06/19/2018. Pregravid weight 125 lb (56.7 kg) Total Weight Gain 5 lb (2.268 kg) Urinalysis: Urine Protein    Urine Glucose    Fetal Status: Fetal Heart Rate (bpm): 150   Movement: Absent     General:  Alert, oriented and cooperative. Patient is in no acute distress.  Skin: Skin is warm and dry. No rash noted.   Cardiovascular: Normal heart rate noted  Respiratory: Normal respiratory effort, no problems with respiration noted  Abdomen: Soft, gravid, appropriate for gestational age. Pain/Pressure: Absent     Pelvic:  Cervical exam deferred        Extremities: Normal range of motion.  Edema: None  Mental Status: Normal mood and affect. Normal behavior. Normal judgment and thought content.   Assessment   28 y.o. G1P0000 at [redacted]w[redacted]d by  03/30/2019, by Ultrasound presenting for routine prenatal visit  Plan   pregnancy Problems (from 07/28/18 to present)    Problem Noted Resolved    Supervision of normal first pregnancy 07/28/2018 by Conard Novak, MD No   Overview Addendum 08/12/2018  5:20 PM by Natale Milch, MD      Clinic [facility] Prenatal Labs  Dating  7 wk [place] Blood type: B/Positive/-- (11/11 1127)   Genetic Screen 1 Screen:     AFP:      Quad:      NIPS:    Antibody:Negative (11/11 1127)  Anatomic [place]  Rubella: 4.59 (11/11 1127)  Varicella: Immune  GTT Early:        28 wk:      RPR: Non Reactive (11/11 1127)   Rhogam  not needed HBsAg: Negative (11/11 1127)   TDaP vaccine                       HIV: Non Reactive (11/11 1127)   Flu Shot                                GBS:   Contraception  Pap: 2019 NIL  CBB     CS/VBAC    Baby Food    Support Person              Preterm labor symptoms and general obstetric precautions including but not limited to vaginal bleeding, contractions, leaking of fluid and fetal movement were reviewed in detail with the patient. Please refer to After Visit Summary for other counseling recommendations.   - declines msAFP  Return in about 3 weeks (around 11/03/2018) for U/S for anatomy and routine prenatal after.  Thomasene Mohair, MD, [facility], [facility] 10/13/2018 4:42 PM

## 2018-11-05 ENCOUNTER — Ambulatory Visit (INDEPENDENT_AMBULATORY_CARE_PROVIDER_SITE_OTHER): Payer: BC Managed Care – PPO | Admitting: Obstetrics & Gynecology

## 2018-11-05 ENCOUNTER — Ambulatory Visit (INDEPENDENT_AMBULATORY_CARE_PROVIDER_SITE_OTHER): Payer: BC Managed Care – PPO

## 2018-11-05 VITALS — BP 100/60 | Wt 131.0 lb

## 2018-11-05 DIAGNOSIS — Z363 Encounter for antenatal screening for malformations: Secondary | ICD-10-CM | POA: Diagnosis not present

## 2018-11-05 DIAGNOSIS — Z362 Encounter for other antenatal screening follow-up: Secondary | ICD-10-CM

## 2018-11-05 DIAGNOSIS — Z3A19 19 weeks gestation of pregnancy: Secondary | ICD-10-CM

## 2018-11-05 DIAGNOSIS — Z3402 Encounter for supervision of normal first pregnancy, second trimester: Secondary | ICD-10-CM

## 2018-11-05 LAB — POCT URINALYSIS DIPSTICK OB
Glucose, UA: NEGATIVE
POC,PROTEIN,UA: NEGATIVE

## 2018-11-05 NOTE — Addendum Note (Signed)
Addended by: Cornelius Moras D on: 11/05/2018 03:54 PM   Modules accepted: Orders

## 2018-11-05 NOTE — Progress Notes (Signed)
  Subjective  Fetal Movement? yes Contractions? no Leaking Fluid? no Vaginal Bleeding? no  Objective  BP 100/60   Wt 131 lb (59.4 kg)   LMP 06/19/2018 (Approximate)   BMI 20.52 kg/m  General: NAD Pumonary: no increased work of breathing Abdomen: gravid, non-tender Extremities: no edema Psychiatric: mood appropriate, affect full  Assessment  28 y.o. G1P0000 at [redacted]w[redacted]d by  03/30/2019, by Ultrasound presenting for routine prenatal visit  Plan   Problem List Items Addressed This Visit      Other   Supervision of normal first pregnancy - Primary    Other Visit Diagnoses    [redacted] weeks gestation of pregnancy       Encounter for other antenatal screening follow-up       Relevant Orders   US OB Follow Up    Review of ULTRASOUND. I have personally reviewed images and report of recent ultrasound done at [facility]. There is a singleton gestation with subjectively normal amniotic fluid volume. The fetal biometry correlates with established dating. Detailed evaluation of the fetal anatomy was performed.The fetal anatomical survey appears within normal limits within the resolution of ultrasound as described above.  It must be noted that a normal ultrasound is unable to rule out fetal aneuploidy.    Annamarie Major, MD, [facility], [facility] 11/05/2018  3:28 PM

## 2018-11-18 ENCOUNTER — Other Ambulatory Visit: Payer: Self-pay | Admitting: Obstetrics & Gynecology

## 2018-12-03 ENCOUNTER — Ambulatory Visit: Payer: BC Managed Care – PPO

## 2018-12-03 ENCOUNTER — Encounter: Payer: BC Managed Care – PPO | Admitting: Obstetrics & Gynecology

## 2018-12-04 ENCOUNTER — Other Ambulatory Visit: Payer: Self-pay

## 2018-12-04 ENCOUNTER — Ambulatory Visit (INDEPENDENT_AMBULATORY_CARE_PROVIDER_SITE_OTHER): Payer: BC Managed Care – PPO

## 2018-12-04 ENCOUNTER — Ambulatory Visit (INDEPENDENT_AMBULATORY_CARE_PROVIDER_SITE_OTHER): Payer: BC Managed Care – PPO | Admitting: Certified Nurse Midwife

## 2018-12-04 VITALS — BP 80/50 | Wt 129.0 lb

## 2018-12-04 DIAGNOSIS — Z362 Encounter for other antenatal screening follow-up: Secondary | ICD-10-CM | POA: Diagnosis not present

## 2018-12-04 DIAGNOSIS — Z113 Encounter for screening for infections with a predominantly sexual mode of transmission: Secondary | ICD-10-CM

## 2018-12-04 DIAGNOSIS — Z131 Encounter for screening for diabetes mellitus: Secondary | ICD-10-CM

## 2018-12-04 DIAGNOSIS — Z3A23 23 weeks gestation of pregnancy: Secondary | ICD-10-CM

## 2018-12-04 DIAGNOSIS — Z3402 Encounter for supervision of normal first pregnancy, second trimester: Secondary | ICD-10-CM

## 2018-12-04 LAB — POCT URINALYSIS DIPSTICK OB
Glucose, UA: NEGATIVE
PROTEIN: NEGATIVE

## 2018-12-04 NOTE — Progress Notes (Signed)
ROB at 23wk3d: Doing well. Feeling FM. Follow up anatomy scan now complete for spine, face, 4 chamber heart. Discussed breast feeding and given Ready, Set, Baby pamphlet.  Schedule for prenatal classes given to patient. RTO in 4 weeks for ROB and 28 week labs.

## 2018-12-04 NOTE — Progress Notes (Signed)
ROB with limited U/S- no concerns

## 2019-01-01 ENCOUNTER — Other Ambulatory Visit: Payer: BC Managed Care – PPO

## 2019-01-01 ENCOUNTER — Encounter: Payer: Self-pay | Admitting: Maternal Newborn

## 2019-01-01 ENCOUNTER — Other Ambulatory Visit: Payer: Self-pay

## 2019-01-01 ENCOUNTER — Ambulatory Visit (INDEPENDENT_AMBULATORY_CARE_PROVIDER_SITE_OTHER): Payer: BC Managed Care – PPO | Admitting: Maternal Newborn

## 2019-01-01 VITALS — BP 80/60 | Wt 136.0 lb

## 2019-01-01 DIAGNOSIS — Z3402 Encounter for supervision of normal first pregnancy, second trimester: Secondary | ICD-10-CM

## 2019-01-01 DIAGNOSIS — Z3400 Encounter for supervision of normal first pregnancy, unspecified trimester: Secondary | ICD-10-CM

## 2019-01-01 DIAGNOSIS — Z131 Encounter for screening for diabetes mellitus: Secondary | ICD-10-CM

## 2019-01-01 DIAGNOSIS — Z113 Encounter for screening for infections with a predominantly sexual mode of transmission: Secondary | ICD-10-CM

## 2019-01-01 DIAGNOSIS — Z3A27 27 weeks gestation of pregnancy: Secondary | ICD-10-CM

## 2019-01-01 LAB — POCT URINALYSIS DIPSTICK OB
Glucose, UA: NEGATIVE
POC,PROTEIN,UA: NEGATIVE

## 2019-01-01 NOTE — Patient Instructions (Signed)
Hello,  Given the current COVID-19 pandemic, our practice is making changes in how we are providing care to our patients. We are limiting in-person visits for the safety of all of our patients.   As a practice, we have met to discuss the best way to minimize visits, but still provide excellent care to our expecting mothers.  We have decided on the following visit structure for low-risk pregnancies.  Initial Pregnancy visit will be conducted as a telephone or web visit.  Between 10-14 weeks  there will be one in-person visit for an ultrasound, lab work, and genetic screening. 20 weeks in-person visit with an anatomy ultrasound  28 weeks in-person office visit for a 1-hour glucose test and a TDAP vaccination 32 weeks in-person office visit 34 weeks telephone visit 36 weeks in-person office visit for GBS, chlamydia, and gonorrhea testing 38 weeks in-person office visit 40 weeks in-person office visit  Understandably, some patients will require more visits than what is outlined above. Additional visits will be determined on a case-by-case basis.   We will, as always, be available for emergencies or to address concerns that might arise between in-person visits. We ask that you allow [place] the opportunity to address any concerns over the phone or through a virtual visit first. We will be available to return your phone calls throughout the day.   If you are able to purchase a scale, a blood pressure machine, and a home fetal doppler visits could be limited further. This will help decrease your exposure risks, but these purchases are not a necessity.   Things seem to change daily and there is the possibility that this structure could change, please be patient as we adapt to a new way of caring for patients.   Thank you for trusting [place] with your prenatal care. Our practice values you and looks forward to providing you with excellent care.   Sincerely,   [facility], [place]     COVID-19 and Your Pregnancy FAQ  How can I prevent infection with COVID-19 during my pregnancy? Social distancing is key. Please limit any interactions in public. Try and work from home if possible. Frequently wash your hands after touching possibly contaminated surfaces. Avoid touching your face.  Minimize trips to the store. Consider online ordering when possible.   Should I wear a mask? YES. It is recommended by the CDC that all people wear a cloth mask or facial covering in public. This will help reduce transmission as well as your risk or acquiring COVID-19. New studies are showing that even asymptomatic individuals can spread the virus from talking.   What are the symptoms of COVID-19? Fever (greater than 100.4 F), dry cough, shortness of breath.  Am I more at risk for COVID-19 since I am pregnant? There is not currently data showing that pregnant women are more adversely impacted by COVID-19 than the general population. However, we know that pregnant women tend to have worse respiratory complications from similar diseases such as the flu and SARS and for this reason should be considered an at-risk population.  What do I do if I am experiencing the symptoms of COVID-19? Testing is being limited because of test availability. If you are experiencing symptoms you should quarantine yourself, and the members of your family, for at least 2 weeks at home.   Please visit this website for more information: [email or web address]  When should I go to the Emergency Room? Please go to the emergency room if you  are experiencing ANY of these symptoms*:  1.    Difficulty breathing or shortness of breath 2.    Persistent pain or pressure in the chest 3.    Confusion or difficulty being aroused (or awakened) 4.    Bluish lips or face  *This list is not all inclusive. Please consult our office for any other symptoms that are severe or  concerning.  What do I do if I am having difficulty breathing? You should go to the Emergency Room for evaluation. At this time they have a tent set up for evaluating patients with COVID-19 symptoms.   How will my prenatal care be different because of the COVID-19 pandemic? It has been recommended to reduce the frequency of face-to-face visits and use resources such as telephone and virtual visits when possible. Using a scale, blood pressure machine and fetal doppler at home can further help reduce face-to-face visits. You will be provided with additional information on this topic.  We ask that you come to your visits alone to minimize potential exposures to  COVID-19.  How can I receive childbirth education? At this time in-person classes have been cancelled. You can register for online childbirth education, breastfeeding, and newborn care classes.  Please visit:  [email or web address] for more information  How will my hospital birth experience be different? The hospital is currently limiting visitors. This means that while you are in labor you can only have one person at the hospital with you. Additional family members will not be allowed to wait in the building or outside your room. Your one support person can be the father of the baby, a relative, a doula, or a friend. Once one support person is designated that person will wear a band. This band cannot be shared with multiple people.  Nitrous Gas is not being offered for pain relief since the tubing and filter for the machine can not be sanitized in a way to guarantee prevention of transmission of COVID-19.  Nasal cannula use of oxygen for fetal indications has also been discontinued.  Currently a clear plastic sheet is being hung between mom and the delivering provider during pushing and delivery to help prevent transmission of COVID-19.      How long will I stay in the hospital for after giving birth? It is also recommended that  discharge home be expedited during the COVID-19 outbreak. This means staying for 1 day after a vaginal delivery and 2 days after a cesarean section. Patients who need to stay longer for medical reasons are allowed to do so, but the goal will be for expedited discharge home.   What if I have COVID-19 and I am in labor? We ask that you wear a mask while on labor and delivery. We will try and accommodate you being placed in a room that is capable of filtering the air. Please call ahead if you are in labor and on your way to the hospital. The phone number for labor and delivery at [facility] is [phone].  If I have COVID-19 when my baby is born how can I prevent my baby from contracting COVID-19? This is an issue that will have to be discussed on a case-by-case basis. Current recommendations suggest providing separate isolation rooms for both the mother and new infant as well as limiting visitors. However, there are practical challenges to this recommendation. The situation will assuredly change and decisions will be influenced by the desires of the mother and availability of space.    Some suggestions are the use of a curtain or physical barrier between mom and infant, hand hygiene, mom wearing a mask, or 6 feet of spacing between a mom and infant.   Can I breastfeed during the COVID-19 pandemic?   Yes, breastfeeding is encouraged.  Can I breastfeed if I have COVID-19? Yes. Covid-19 has not been found in breast milk. This means you cannot give COVID-19 to your child through breast milk. Breast feeding will also help pass antibodies to fight infection to your baby.   What precautions should I take when breastfeeding if I have COVID-19? If a mother and newborn do room-in and the mother wishes to feed at the breast, she should put on a facemask and practice hand hygiene before each feeding.  What precautions should I take when pumping if I have COVID-19? Prior to expressing  breast milk, mothers should practice hand hygiene. After each pumping session, all parts that come into contact with breast milk should be thoroughly washed and the entire pump should be appropriately disinfected per the manufacturer's instructions. This expressed breast milk should be fed to the newborn by a healthy caregiver.  What if I am pregnant and work in healthcare? Based on limited data regarding COVID-19 and pregnancy, ACOG currently does not propose creating additional restrictions on pregnant health care personnel because of COVID-19 alone. Pregnant women do not appear to be at higher risk of severe disease related to COVID-19. Pregnant health care personnel should follow CDC risk assessment and infection control guidelines for health care personnel exposed to patients with suspected or confirmed COVID-19. Adherence to recommended infection prevention and control practices is an important part of protecting all health care personnel in health care settings.    Information on COVID-19 in pregnancy is very limited; however, facilities may want to consider limiting exposure of pregnant health care personnel to patients with confirmed or suspected COVID-19 infection, especially during higher-risk procedures (eg, aerosol-generating procedures), if feasible, based on staffing availability.

## 2019-01-01 NOTE — Progress Notes (Signed)
Routine Prenatal Care Visit  Subjective  Helen RasherKristi F Streety is a 28 y.o. G1P0000 at 1418w3d being seen today for ongoing prenatal care.  She is currently monitored for the following issues for this low-risk pregnancy and has Female infertility associated with anovulation; Amenorrhea; Delayed gastric emptying; Early satiety; Family history of thyroid disease in sister; and Supervision of normal first pregnancy on their problem list.  ----------------------------------------------------------------------------------- Patient reports no complaints.   Contractions: Not present. Vag. Bleeding: None.  Movement: Present. No leaking of fluid.  ----------------------------------------------------------------------------------- The following portions of the patient's history were reviewed and updated as appropriate: allergies, current medications, past family history, past medical history, past social history, past surgical history and problem list. Problem list updated.  Objective  Blood pressure (!) 80/60, weight 136 lb (61.7 kg), last menstrual period 06/19/2018. Pregravid weight 125 lb (56.7 kg) Total Weight Gain 11 lb (4.99 kg)   Urinalysis: Urine dipstick shows negative for glucose, protein.  Fetal Status: Fetal Heart Rate (bpm): 147   Movement: Present     General:  Alert, oriented and cooperative. Patient is in no acute distress.  Skin: Skin is warm and dry. No rash noted.   Cardiovascular: Normal heart rate noted  Respiratory: Normal respiratory effort, no problems with respiration noted  Abdomen: Soft, gravid, appropriate for gestational age. Pain/Pressure: Absent     Pelvic:  Cervical exam deferred        Extremities: Normal range of motion.  Edema: None  Mental Status: Normal mood and affect. Normal behavior. Normal judgment and thought content.    Assessment   27 y.o. G1P0000 at 5918w3d, EDD 03/30/2019 by Ultrasound presenting for a routine prenatal visit.  Plan   pregnancy  Problems (from 07/28/18 to present)    Problem Noted Resolved   Supervision of normal first pregnancy 07/28/2018 by Conard NovakJackson, Stephen D, MD No   Overview Addendum 08/12/2018  5:20 PM by Natale MilchSchuman, Christanna R, MD      [facility]inic [facility] Prenatal Labs  Dating  7 wk US Blood type: B/Positive/-- (11/11 1127)   Genetic Screen 1 Screen:     AFP:      Quad:      NIPS:    Antibody:Negative (11/11 1127)  Anatomic US  Rubella: 4.59 (11/11 1127)  Varicella: Immune  GTT Early:        28 wk:      RPR: Non Reactive (11/11 1127)   Rhogam  not needed HBsAg: Negative (11/11 1127)   TDaP vaccine                       HIV: Non Reactive (11/11 1127)   Flu Shot                                GBS:   Contraception  Pap: 2019 NIL  CBB     CS/VBAC    Baby Food    Support Person                Preterm labor symptoms and general obstetric precautions including but not limited to vaginal bleeding, contractions, leaking of fluid and fetal movement were reviewed.  Discussed spacing of visits due to COVID-19 precautions and other changes to prenatal care/labor and delivery; encouraged to stay in touch via MyChart/telephone for any concerns or questions. Written FAQ and more information about visit scheduling available in the After Visit Summary.  Return in about 4 weeks (around 01/29/2019) for ROB.  Marcelyn Bruins, CNM 01/01/2019

## 2019-01-02 LAB — 28 WEEK RH+PANEL
Basophils Absolute: 0 10*3/uL (ref 0.0–0.2)
Basos: 0 %
EOS (ABSOLUTE): 0.1 10*3/uL (ref 0.0–0.4)
Eos: 1 %
Gestational Diabetes Screen: 86 mg/dL (ref 65–139)
HIV Screen 4th Generation wRfx: NONREACTIVE
Hematocrit: 35.8 % (ref 34.0–46.6)
Hemoglobin: 12.4 g/dL (ref 11.1–15.9)
Immature Grans (Abs): 0.1 10*3/uL (ref 0.0–0.1)
Immature Granulocytes: 1 %
Lymphocytes Absolute: 1.3 10*3/uL (ref 0.7–3.1)
Lymphs: 19 %
MCH: 32.1 pg (ref 26.6–33.0)
MCHC: 34.6 g/dL (ref 31.5–35.7)
MCV: 93 fL (ref 79–97)
Monocytes Absolute: 0.3 10*3/uL (ref 0.1–0.9)
Monocytes: 5 %
Neutrophils Absolute: 5.1 10*3/uL (ref 1.4–7.0)
Neutrophils: 74 %
Platelets: 245 10*3/uL (ref 150–450)
RBC: 3.86 x10E6/uL (ref 3.77–5.28)
RDW: 13 % (ref 11.7–15.4)
RPR Ser Ql: NONREACTIVE
WBC: 6.9 10*3/uL (ref 3.4–10.8)

## 2019-01-07 ENCOUNTER — Encounter: Payer: Self-pay | Admitting: Obstetrics & Gynecology

## 2019-01-29 ENCOUNTER — Encounter: Payer: BC Managed Care – PPO | Admitting: Obstetrics and Gynecology

## 2019-01-30 ENCOUNTER — Other Ambulatory Visit: Payer: Self-pay

## 2019-01-30 ENCOUNTER — Encounter: Payer: Self-pay | Admitting: Obstetrics & Gynecology

## 2019-01-30 ENCOUNTER — Ambulatory Visit (INDEPENDENT_AMBULATORY_CARE_PROVIDER_SITE_OTHER): Payer: BC Managed Care – PPO | Admitting: Obstetrics & Gynecology

## 2019-01-30 VITALS — BP 80/60 | Wt 137.0 lb

## 2019-01-30 DIAGNOSIS — Z3A30 30 weeks gestation of pregnancy: Secondary | ICD-10-CM

## 2019-01-30 DIAGNOSIS — Z3400 Encounter for supervision of normal first pregnancy, unspecified trimester: Secondary | ICD-10-CM

## 2019-01-30 DIAGNOSIS — Z23 Encounter for immunization: Secondary | ICD-10-CM | POA: Diagnosis not present

## 2019-01-30 DIAGNOSIS — Z3403 Encounter for supervision of normal first pregnancy, third trimester: Secondary | ICD-10-CM

## 2019-01-30 LAB — POCT URINALYSIS DIPSTICK OB
Glucose, UA: NEGATIVE
POC,PROTEIN,UA: NEGATIVE

## 2019-01-30 NOTE — Progress Notes (Signed)
  Subjective  Fetal Movement? yes Contractions? no Leaking Fluid? no Vaginal Bleeding? no  Objective  BP (!) 80/60   Wt 137 lb (62.1 kg)   LMP 06/19/2018 (Approximate)   BMI 21.46 kg/m  General: NAD Pumonary: no increased work of breathing Abdomen: gravid, non-tender Extremities: no edema Psychiatric: mood appropriate, affect full  Assessment  27 y.o. G1P0000 at [redacted]w[redacted]d by  03/30/2019, by Ultrasound presenting for routine prenatal visit  Plan   Problem List Items Addressed This Visit      Other   Supervision of normal first pregnancy    Other Visit Diagnoses    Need for Tdap vaccination    -  Primary   [redacted] weeks gestation of pregnancy       Relevant Orders   POC Urinalysis Dipstick OB (Completed)      pregnancy Problems (from 07/28/18 to present)    Problem Noted Resolved   Supervision of normal first pregnancy 07/28/2018 by Conard Novak, MD No   Overview Addendum 01/30/2019  9:09 AM by Nadara Mustard, MD      [facility] Prenatal Labs  Dating  7 wk [place] Blood type: B/Positive/-- (11/11 1127)   Genetic Screen 1 Screen: Negative    Inheritest Negative   Antibody:Negative (11/11 1127)  Anatomic [place] Complete 3/19 Rubella: 4.59 (11/11 1127)  Varicella: Immune  GTT 28 wk: normal      RPR: Non Reactive (11/11 1127)   Rhogam  not needed HBsAg: Negative (11/11 1127)   TDaP vaccine   01/30/2019 HIV: Non Reactive (11/11 1127)   Flu Shot   06/2018                          GBS:   Contraception   unsure Pap: 2019 NIL  CBB    no   CS/VBAC   n/a   Baby Food   breast   Support Person    Husband             PNV, [facility], PTL precautions  Annamarie Major, MD, [facility], [facility] 01/30/2019  9:10 AM

## 2019-01-30 NOTE — Patient Instructions (Signed)
Third Trimester of Pregnancy The third trimester is from week 28 through week 40 (months 7 through 9). The third trimester is a time when the unborn baby (fetus) is growing rapidly. At the end of the ninth month, the fetus is about 20 inches in length and weighs 6-10 pounds. Body changes during your third trimester Your body will continue to go through many changes during pregnancy. The changes vary from woman to woman. During the third trimester:  Your weight will continue to increase. You can expect to gain 25-35 pounds (11-16 kg) by the end of the pregnancy.  You may begin to get stretch marks on your hips, abdomen, and breasts.  You may urinate more often because the fetus is moving lower into your pelvis and pressing on your bladder.  You may develop or continue to have heartburn. This is caused by increased hormones that slow down muscles in the digestive tract.  You may develop or continue to have constipation because increased hormones slow digestion and cause the muscles that push waste through your intestines to relax.  You may develop hemorrhoids. These are swollen veins (varicose veins) in the rectum that can itch or be painful.  You may develop swollen, bulging veins (varicose veins) in your legs.  You may have increased body aches in the pelvis, back, or thighs. This is due to weight gain and increased hormones that are relaxing your joints.  You may have changes in your hair. These can include thickening of your hair, rapid growth, and changes in texture. Some women also have hair loss during or after pregnancy, or hair that feels dry or thin. Your hair will most likely return to normal after your baby is born.  Your breasts will continue to grow and they will continue to become tender. A yellow fluid (colostrum) may leak from your breasts. This is the first milk you are producing for your baby.  Your belly button may stick out.  You may notice more swelling in your hands,  face, or ankles.  You may have increased tingling or numbness in your hands, arms, and legs. The skin on your belly may also feel numb.  You may feel short of breath because of your expanding uterus.  You may have more problems sleeping. This can be caused by the size of your belly, increased need to urinate, and an increase in your body's metabolism.  You may notice the fetus "dropping," or moving lower in your abdomen (lightening).  You may have increased vaginal discharge.  You may notice your joints feel loose and you may have pain around your pelvic bone. What to expect at prenatal visits You will have prenatal exams every 2 weeks until week 36. Then you will have weekly prenatal exams. During a routine prenatal visit:  You will be weighed to make sure you and the baby are growing normally.  Your blood pressure will be taken.  Your abdomen will be measured to track your baby's growth.  The fetal heartbeat will be listened to.  Any test results from the previous visit will be discussed.  You may have a cervical check near your due date to see if your cervix has softened or thinned (effaced).  You will be tested for Group B streptococcus. This happens between 35 and 37 weeks. Your health care provider may ask you:  What your birth plan is.  How you are feeling.  If you are feeling the baby move.  If you have had any abnormal   symptoms, such as leaking fluid, bleeding, severe headaches, or abdominal cramping.  If you are using any tobacco products, including cigarettes, chewing tobacco, and electronic cigarettes.  If you have any questions. Other tests or screenings that may be performed during your third trimester include:  Blood tests that check for low iron levels (anemia).  Fetal testing to check the health, activity level, and growth of the fetus. Testing is done if you have certain medical conditions or if there are problems during the pregnancy.  Nonstress test  (NST). This test checks the health of your baby to make sure there are no signs of problems, such as the baby not getting enough oxygen. During this test, a belt is placed around your belly. The baby is made to move, and its heart rate is monitored during movement. What is false labor? False labor is a condition in which you feel small, irregular tightenings of the muscles in the womb (contractions) that usually go away with rest, changing position, or drinking water. These are called Braxton Hicks contractions. Contractions may last for hours, days, or even weeks before true labor sets in. If contractions come at regular intervals, become more frequent, increase in intensity, or become painful, you should see your health care provider. What are the signs of labor?  Abdominal cramps.  Regular contractions that start at 10 minutes apart and become stronger and more frequent with time.  Contractions that start on the top of the uterus and spread down to the lower abdomen and back.  Increased pelvic pressure and dull back pain.  A watery or bloody mucus discharge that comes from the vagina.  Leaking of amniotic fluid. This is also known as your "water breaking." It could be a slow trickle or a gush. Let your health care provider know if it has a color or strange odor. If you have any of these signs, call your health care provider right away, even if it is before your due date. Follow these instructions at home: Medicines  Follow your health care provider's instructions regarding medicine use. Specific medicines may be either safe or unsafe to take during pregnancy.  Take a prenatal vitamin that contains at least 600 micrograms (mcg) of folic acid.  If you develop constipation, try taking a stool softener if your health care provider approves. Eating and drinking   Eat a balanced diet that includes fresh fruits and vegetables, whole grains, good sources of protein such as meat, eggs, or tofu,  and low-fat dairy. Your health care provider will help you determine the amount of weight gain that is right for you.  Avoid raw meat and uncooked cheese. These carry germs that can cause birth defects in the baby.  If you have low calcium intake from food, talk to your health care provider about whether you should take a daily calcium supplement.  Eat four or five small meals rather than three large meals a day.  Limit foods that are high in fat and processed sugars, such as fried and sweet foods.  To prevent constipation: ? Drink enough fluid to keep your urine clear or pale yellow. ? Eat foods that are high in fiber, such as fresh fruits and vegetables, whole grains, and beans. Activity  Exercise only as directed by your health care provider. Most women can continue their usual exercise routine during pregnancy. Try to exercise for 30 minutes at least 5 days a week. Stop exercising if you experience uterine contractions.  Avoid heavy lifting.  Do   not exercise in extreme heat or humidity, or at high altitudes.  Wear low-heel, comfortable shoes.  Practice good posture.  You may continue to have sex unless your health care provider tells you otherwise. Relieving pain and discomfort  Take frequent breaks and rest with your legs elevated if you have leg cramps or low back pain.  Take warm sitz baths to soothe any pain or discomfort caused by hemorrhoids. Use hemorrhoid cream if your health care provider approves.  Wear a good support bra to prevent discomfort from breast tenderness.  If you develop varicose veins: ? Wear support pantyhose or compression stockings as told by your healthcare provider. ? Elevate your feet for 15 minutes, 3-4 times a day. Prenatal care  Write down your questions. Take them to your prenatal visits.  Keep all your prenatal visits as told by your health care provider. This is important. Safety  Wear your seat belt at all times when driving.  Make  a list of emergency phone numbers, including numbers for family, friends, the hospital, and police and fire departments. General instructions  Avoid cat litter boxes and soil used by cats. These carry germs that can cause birth defects in the baby. If you have a cat, ask someone to clean the litter box for you.  Do not travel far distances unless it is absolutely necessary and only with the approval of your health care provider.  Do not use hot tubs, steam rooms, or saunas.  Do not drink alcohol.  Do not use any products that contain nicotine or tobacco, such as cigarettes and e-cigarettes. If you need help quitting, ask your health care provider.  Do not use any medicinal herbs or unprescribed drugs. These chemicals affect the formation and growth of the baby.  Do not douche or use tampons or scented sanitary pads.  Do not cross your legs for long periods of time.  To prepare for the arrival of your baby: ? Take prenatal classes to understand, practice, and ask questions about labor and delivery. ? Make a trial run to the hospital. ? Visit the hospital and tour the maternity area. ? Arrange for maternity or paternity leave through employers. ? Arrange for family and friends to take care of pets while you are in the hospital. ? Purchase a rear-facing car seat and make sure you know how to install it in your car. ? Pack your hospital bag. ? Prepare the baby's nursery. Make sure to remove all pillows and stuffed animals from the baby's crib to prevent suffocation.  Visit your dentist if you have not gone during your pregnancy. Use a soft toothbrush to brush your teeth and be gentle when you floss. Contact a health care provider if:  You are unsure if you are in labor or if your water has broken.  You become dizzy.  You have mild pelvic cramps, pelvic pressure, or nagging pain in your abdominal area.  You have lower back pain.  You have persistent nausea, vomiting, or  diarrhea.  You have an unusual or bad smelling vaginal discharge.  You have pain when you urinate. Get help right away if:  Your water breaks before 37 weeks.  You have regular contractions less than 5 minutes apart before 37 weeks.  You have a fever.  You are leaking fluid from your vagina.  You have spotting or bleeding from your vagina.  You have severe abdominal pain or cramping.  You have rapid weight loss or weight gain.  You have   shortness of breath with chest pain.  You notice sudden or extreme swelling of your face, hands, ankles, feet, or legs.  Your baby makes fewer than 10 movements in 2 hours.  You have severe headaches that do not go away when you take medicine.  You have vision changes. Summary  The third trimester is from week 28 through week 40, months 7 through 9. The third trimester is a time when the unborn baby (fetus) is growing rapidly.  During the third trimester, your discomfort may increase as you and your baby continue to gain weight. You may have abdominal, leg, and back pain, sleeping problems, and an increased need to urinate.  During the third trimester your breasts will keep growing and they will continue to become tender. A yellow fluid (colostrum) may leak from your breasts. This is the first milk you are producing for your baby.  False labor is a condition in which you feel small, irregular tightenings of the muscles in the womb (contractions) that eventually go away. These are called Braxton Hicks contractions. Contractions may last for hours, days, or even weeks before true labor sets in.  Signs of labor can include: abdominal cramps; regular contractions that start at 10 minutes apart and become stronger and more frequent with time; watery or bloody mucus discharge that comes from the vagina; increased pelvic pressure and dull back pain; and leaking of amniotic fluid. This information is not intended to replace advice given to you by your  health care provider. Make sure you discuss any questions you have with your health care provider. Document Released: 08/28/2001 Document Revised: 10/09/2016 Document Reviewed: 10/09/2016 Elsevier Interactive Patient Education  2019 [employer]  

## 2019-02-13 ENCOUNTER — Encounter: Payer: Self-pay | Admitting: Obstetrics & Gynecology

## 2019-02-13 ENCOUNTER — Ambulatory Visit (INDEPENDENT_AMBULATORY_CARE_PROVIDER_SITE_OTHER): Payer: BC Managed Care – PPO | Admitting: Obstetrics & Gynecology

## 2019-02-13 ENCOUNTER — Other Ambulatory Visit: Payer: Self-pay

## 2019-02-13 VITALS — Wt 140.0 lb

## 2019-02-13 DIAGNOSIS — Z3A33 33 weeks gestation of pregnancy: Secondary | ICD-10-CM

## 2019-02-13 DIAGNOSIS — Z3400 Encounter for supervision of normal first pregnancy, unspecified trimester: Secondary | ICD-10-CM

## 2019-02-13 DIAGNOSIS — Z3403 Encounter for supervision of normal first pregnancy, third trimester: Secondary | ICD-10-CM

## 2019-02-13 NOTE — Patient Instructions (Signed)
Third Trimester of Pregnancy The third trimester is from week 28 through week 40 (months 7 through 9). The third trimester is a time when the unborn baby (fetus) is growing rapidly. At the end of the ninth month, the fetus is about 20 inches in length and weighs 6-10 pounds. Body changes during your third trimester Your body will continue to go through many changes during pregnancy. The changes vary from woman to woman. During the third trimester:  Your weight will continue to increase. You can expect to gain 25-35 pounds (11-16 kg) by the end of the pregnancy.  You may begin to get stretch marks on your hips, abdomen, and breasts.  You may urinate more often because the fetus is moving lower into your pelvis and pressing on your bladder.  You may develop or continue to have heartburn. This is caused by increased hormones that slow down muscles in the digestive tract.  You may develop or continue to have constipation because increased hormones slow digestion and cause the muscles that push waste through your intestines to relax.  You may develop hemorrhoids. These are swollen veins (varicose veins) in the rectum that can itch or be painful.  You may develop swollen, bulging veins (varicose veins) in your legs.  You may have increased body aches in the pelvis, back, or thighs. This is due to weight gain and increased hormones that are relaxing your joints.  You may have changes in your hair. These can include thickening of your hair, rapid growth, and changes in texture. Some women also have hair loss during or after pregnancy, or hair that feels dry or thin. Your hair will most likely return to normal after your baby is born.  Your breasts will continue to grow and they will continue to become tender. A yellow fluid (colostrum) may leak from your breasts. This is the first milk you are producing for your baby.  Your belly button may stick out.  You may notice more swelling in your hands,  face, or ankles.  You may have increased tingling or numbness in your hands, arms, and legs. The skin on your belly may also feel numb.  You may feel short of breath because of your expanding uterus.  You may have more problems sleeping. This can be caused by the size of your belly, increased need to urinate, and an increase in your body's metabolism.  You may notice the fetus "dropping," or moving lower in your abdomen (lightening).  You may have increased vaginal discharge.  You may notice your joints feel loose and you may have pain around your pelvic bone. What to expect at prenatal visits You will have prenatal exams every 2 weeks until week 36. Then you will have weekly prenatal exams. During a routine prenatal visit:  You will be weighed to make sure you and the baby are growing normally.  Your blood pressure will be taken.  Your abdomen will be measured to track your baby's growth.  The fetal heartbeat will be listened to.  Any test results from the previous visit will be discussed.  You may have a cervical check near your due date to see if your cervix has softened or thinned (effaced).  You will be tested for Group B streptococcus. This happens between 35 and 37 weeks. Your health care provider may ask you:  What your birth plan is.  How you are feeling.  If you are feeling the baby move.  If you have had any abnormal   symptoms, such as leaking fluid, bleeding, severe headaches, or abdominal cramping.  If you are using any tobacco products, including cigarettes, chewing tobacco, and electronic cigarettes.  If you have any questions. Other tests or screenings that may be performed during your third trimester include:  Blood tests that check for low iron levels (anemia).  Fetal testing to check the health, activity level, and growth of the fetus. Testing is done if you have certain medical conditions or if there are problems during the pregnancy.  Nonstress test  (NST). This test checks the health of your baby to make sure there are no signs of problems, such as the baby not getting enough oxygen. During this test, a belt is placed around your belly. The baby is made to move, and its heart rate is monitored during movement. What is false labor? False labor is a condition in which you feel small, irregular tightenings of the muscles in the womb (contractions) that usually go away with rest, changing position, or drinking water. These are called Braxton Hicks contractions. Contractions may last for hours, days, or even weeks before true labor sets in. If contractions come at regular intervals, become more frequent, increase in intensity, or become painful, you should see your health care provider. What are the signs of labor?  Abdominal cramps.  Regular contractions that start at 10 minutes apart and become stronger and more frequent with time.  Contractions that start on the top of the uterus and spread down to the lower abdomen and back.  Increased pelvic pressure and dull back pain.  A watery or bloody mucus discharge that comes from the vagina.  Leaking of amniotic fluid. This is also known as your "water breaking." It could be a slow trickle or a gush. Let your health care provider know if it has a color or strange odor. If you have any of these signs, call your health care provider right away, even if it is before your due date. Follow these instructions at home: Medicines  Follow your health care provider's instructions regarding medicine use. Specific medicines may be either safe or unsafe to take during pregnancy.  Take a prenatal vitamin that contains at least 600 micrograms (mcg) of folic acid.  If you develop constipation, try taking a stool softener if your health care provider approves. Eating and drinking   Eat a balanced diet that includes fresh fruits and vegetables, whole grains, good sources of protein such as meat, eggs, or tofu,  and low-fat dairy. Your health care provider will help you determine the amount of weight gain that is right for you.  Avoid raw meat and uncooked cheese. These carry germs that can cause birth defects in the baby.  If you have low calcium intake from food, talk to your health care provider about whether you should take a daily calcium supplement.  Eat four or five small meals rather than three large meals a day.  Limit foods that are high in fat and processed sugars, such as fried and sweet foods.  To prevent constipation: ? Drink enough fluid to keep your urine clear or pale yellow. ? Eat foods that are high in fiber, such as fresh fruits and vegetables, whole grains, and beans. Activity  Exercise only as directed by your health care provider. Most women can continue their usual exercise routine during pregnancy. Try to exercise for 30 minutes at least 5 days a week. Stop exercising if you experience uterine contractions.  Avoid heavy lifting.  Do   not exercise in extreme heat or humidity, or at high altitudes.  Wear low-heel, comfortable shoes.  Practice good posture.  You may continue to have sex unless your health care provider tells you otherwise. Relieving pain and discomfort  Take frequent breaks and rest with your legs elevated if you have leg cramps or low back pain.  Take warm sitz baths to soothe any pain or discomfort caused by hemorrhoids. Use hemorrhoid cream if your health care provider approves.  Wear a good support bra to prevent discomfort from breast tenderness.  If you develop varicose veins: ? Wear support pantyhose or compression stockings as told by your healthcare provider. ? Elevate your feet for 15 minutes, 3-4 times a day. Prenatal care  Write down your questions. Take them to your prenatal visits.  Keep all your prenatal visits as told by your health care provider. This is important. Safety  Wear your seat belt at all times when driving.  Make  a list of emergency phone numbers, including numbers for family, friends, the hospital, and police and fire departments. General instructions  Avoid cat litter boxes and soil used by cats. These carry germs that can cause birth defects in the baby. If you have a cat, ask someone to clean the litter box for you.  Do not travel far distances unless it is absolutely necessary and only with the approval of your health care provider.  Do not use hot tubs, steam rooms, or saunas.  Do not drink alcohol.  Do not use any products that contain nicotine or tobacco, such as cigarettes and e-cigarettes. If you need help quitting, ask your health care provider.  Do not use any medicinal herbs or unprescribed drugs. These chemicals affect the formation and growth of the baby.  Do not douche or use tampons or scented sanitary pads.  Do not cross your legs for long periods of time.  To prepare for the arrival of your baby: ? Take prenatal classes to understand, practice, and ask questions about labor and delivery. ? Make a trial run to the hospital. ? Visit the hospital and tour the maternity area. ? Arrange for maternity or paternity leave through employers. ? Arrange for family and friends to take care of pets while you are in the hospital. ? Purchase a rear-facing car seat and make sure you know how to install it in your car. ? Pack your hospital bag. ? Prepare the baby's nursery. Make sure to remove all pillows and stuffed animals from the baby's crib to prevent suffocation.  Visit your dentist if you have not gone during your pregnancy. Use a soft toothbrush to brush your teeth and be gentle when you floss. Contact a health care provider if:  You are unsure if you are in labor or if your water has broken.  You become dizzy.  You have mild pelvic cramps, pelvic pressure, or nagging pain in your abdominal area.  You have lower back pain.  You have persistent nausea, vomiting, or  diarrhea.  You have an unusual or bad smelling vaginal discharge.  You have pain when you urinate. Get help right away if:  Your water breaks before 37 weeks.  You have regular contractions less than 5 minutes apart before 37 weeks.  You have a fever.  You are leaking fluid from your vagina.  You have spotting or bleeding from your vagina.  You have severe abdominal pain or cramping.  You have rapid weight loss or weight gain.  You have   shortness of breath with chest pain.  You notice sudden or extreme swelling of your face, hands, ankles, feet, or legs.  Your baby makes fewer than 10 movements in 2 hours.  You have severe headaches that do not go away when you take medicine.  You have vision changes. Summary  The third trimester is from week 28 through week 40, months 7 through 9. The third trimester is a time when the unborn baby (fetus) is growing rapidly.  During the third trimester, your discomfort may increase as you and your baby continue to gain weight. You may have abdominal, leg, and back pain, sleeping problems, and an increased need to urinate.  During the third trimester your breasts will keep growing and they will continue to become tender. A yellow fluid (colostrum) may leak from your breasts. This is the first milk you are producing for your baby.  False labor is a condition in which you feel small, irregular tightenings of the muscles in the womb (contractions) that eventually go away. These are called Braxton Hicks contractions. Contractions may last for hours, days, or even weeks before true labor sets in.  Signs of labor can include: abdominal cramps; regular contractions that start at 10 minutes apart and become stronger and more frequent with time; watery or bloody mucus discharge that comes from the vagina; increased pelvic pressure and dull back pain; and leaking of amniotic fluid. This information is not intended to replace advice given to you by your  health care provider. Make sure you discuss any questions you have with your health care provider. Document Released: 08/28/2001 Document Revised: 10/09/2016 Document Reviewed: 10/09/2016 Elsevier Interactive Patient Education  2019 [employer]  

## 2019-02-13 NOTE — Progress Notes (Signed)
Virtual Visit via Telephone Note  I connected with patient on 02/13/19 at  9:20 AM EDT by telephone and verified that I am speaking with the correct person using two identifiers.   I discussed the limitations, risks, security and privacy concerns of performing an evaluation and management service by telephone and the availability of in person appointments. I also discussed with the patient that there may be a patient responsible charge related to this service. The patient expressed understanding and agreed to proceed.  The patient was at home I spoke with the patient from my office  Helen Black is a 28 y.o. G1P0000 at 5740w4d being seen today for ongoing prenatal care.  She is currently monitored for the following issues for this low-risk pregnancy and has Female infertility associated with anovulation; Amenorrhea; Delayed gastric emptying; Early satiety; Family history of thyroid disease in sister; and Supervision of normal first pregnancy on their problem list.  ----------------------------------------------------------------------------------- Patient reports no complaints.   Denies pain, VB, leaking of fluid.  ----------------------------------------------------------------------------------- The following portions of the patient's history were reviewed and updated as appropriate: allergies, current medications, past family history, past medical history, past social history, past surgical history and problem list. Problem list updated.   Objective  Weight 140 lb (63.5 kg), last menstrual period 06/19/2018. Pregravid weight 125 lb (56.7 kg) Total Weight Gain 15 lb (6.804 kg)  Physical Exam could not be performed. Because of the COVID-19 outbreak this visit was performed over the phone and not in person.   Assessment   28 y.o. G1P0000 at 4740w4d by  03/30/2019, by Ultrasound presenting for routine prenatal visit  Plan  [facility]   Problem Noted Resolved   Supervision  of normal first pregnancy 07/28/2018 by Conard NovakJackson, Stephen D, MD No   Overview Addendum 01/30/2019  9:09 AM by Nadara MustardHarris, Taivon Haroon P, MD      Clinic [facility] Prenatal Labs  Dating  7 wk US Blood type: B/Positive/-- (11/11 1127)   Genetic Screen 1 Screen: Negative    Inheritest Negative   Antibody:Negative (11/11 1127)  Anatomic US Complete 3/19 Rubella: 4.59 (11/11 1127)  Varicella: Immune  GTT 28 wk: normal      RPR: Non Reactive (11/11 1127)   Rhogam  not needed HBsAg: Negative (11/11 1127)   TDaP vaccine   01/30/2019 HIV: Non Reactive (11/11 1127)   Flu Shot   06/2018                          GBS:   Contraception   unsure Pap: 2019 NIL  CBB    no   CS/VBAC   n/a   Baby Food   breast   Support Person    Husband               Gestational age appropriate obstetric precautions including but not limited to vaginal bleeding, contractions, leaking of fluid and fetal movement were reviewed in detail with the patient.     Follow Up Instructions: 2 weeks   I discussed the assessment and treatment plan with the patient. The patient was provided an opportunity to ask questions and all were answered. The patient agreed with the plan and demonstrated an understanding of the instructions.   The patient was advised to call back or seek an in-person evaluation if the symptoms worsen or if the condition fails to improve as anticipated.  I provided 5 minutes of non-face-to-face time during this encounter.  Return in about 3 weeks (around 03/06/2019) for ROB in office.  Annamarie Major, MD [facility], [facility] 02/13/2019 9:33 AM

## 2019-02-20 ENCOUNTER — Encounter: Payer: Self-pay | Admitting: Obstetrics & Gynecology

## 2019-03-02 ENCOUNTER — Other Ambulatory Visit (HOSPITAL_COMMUNITY)
Admission: RE | Admit: 2019-03-02 | Discharge: 2019-03-02 | Disposition: A | Discharge status: Home / Self Care | Payer: BC Managed Care – PPO | Source: Ambulatory Visit | Attending: Advanced Practice Midwife | Admitting: Advanced Practice Midwife

## 2019-03-02 ENCOUNTER — Ambulatory Visit (INDEPENDENT_AMBULATORY_CARE_PROVIDER_SITE_OTHER): Payer: BC Managed Care – PPO | Admitting: Obstetrics and Gynecology

## 2019-03-02 ENCOUNTER — Encounter: Payer: Self-pay | Admitting: Obstetrics and Gynecology

## 2019-03-02 ENCOUNTER — Other Ambulatory Visit: Payer: Self-pay

## 2019-03-02 VITALS — BP 104/64 | Wt 146.0 lb

## 2019-03-02 DIAGNOSIS — Z3403 Encounter for supervision of normal first pregnancy, third trimester: Secondary | ICD-10-CM

## 2019-03-02 DIAGNOSIS — Z3A36 36 weeks gestation of pregnancy: Secondary | ICD-10-CM | POA: Diagnosis present

## 2019-03-02 NOTE — Progress Notes (Signed)
  Routine Prenatal Care Visit  Subjective  Helen Black is a 28 y.o. G1P0000 at [redacted]w[redacted]d being seen today for ongoing prenatal care.  She is currently monitored for the following issues for this low-risk pregnancy and has Female infertility associated with anovulation; Amenorrhea; Delayed gastric emptying; Early satiety; Family history of thyroid disease in sister; and Supervision of normal first pregnancy on their problem list.  ----------------------------------------------------------------------------------- Patient reports no complaints.   Contractions: Not present. Vag. Bleeding: None.  Movement: Present. Denies leaking of fluid.  ----------------------------------------------------------------------------------- The following portions of the patient's history were reviewed and updated as appropriate: allergies, current medications, past family history, past medical history, past social history, past surgical history and problem list. Problem list updated.   Objective  Blood pressure 104/64, weight 146 lb (66.2 kg), last menstrual period 06/19/2018. Pregravid weight 125 lb (56.7 kg) Total Weight Gain 21 lb (9.526 kg) Urinalysis: Urine Protein    Urine Glucose    Fetal Status: Fetal Heart Rate (bpm): 140 Fundal Height: 35 cm Movement: Present  Presentation: Vertex  General:  Alert, oriented and cooperative. Patient is in no acute distress.  Skin: Skin is warm and dry. No rash noted.   Cardiovascular: Normal heart rate noted  Respiratory: Normal respiratory effort, no problems with respiration noted  Abdomen: Soft, gravid, appropriate for gestational age. Pain/Pressure: Absent     Pelvic:  Cervical exam deferred        Extremities: Normal range of motion.  Edema: None  Mental Status: Normal mood and affect. Normal behavior. Normal judgment and thought content.   BSUS: Vertex, MVP 3.3 cm  Assessment   27 y.o. G1P0000 at [redacted]w[redacted]d by  03/30/2019, by Ultrasound presenting for routine  prenatal visit  Plan   pregnancy Problems (from 07/28/18 to present)    Problem Noted Resolved   Supervision of normal first pregnancy 07/28/2018 by Will Bonnet, MD No   Overview Addendum 01/30/2019  9:09 AM by Gae Dry, MD      [facility]inic [facility] Prenatal Labs  Dating  7 wk [place] Blood type: B/Positive/-- (11/11 1127)   Genetic Screen 1 Screen: Negative    Inheritest Negative   Antibody:Negative (11/11 1127)  Anatomic [place] Complete 3/19 Rubella: 4.59 (11/11 1127)  Varicella: Immune  GTT 28 wk: normal      RPR: Non Reactive (11/11 1127)   Rhogam  not needed HBsAg: Negative (11/11 1127)   TDaP vaccine   01/30/2019 HIV: Non Reactive (11/11 1127)   Flu Shot   06/2018                          GBS:   Contraception   unsure Pap: 2019 NIL  CBB    no   CS/VBAC   n/a   Baby Food   breast   Support Person    Husband               Preterm labor symptoms and general obstetric precautions including but not limited to vaginal bleeding, contractions, leaking of fluid and fetal movement were reviewed in detail with the patient. Please refer to After Visit Summary for other counseling recommendations.   - Discussed ARRIVE trial. She will consider this information.   Return in about 1 week (around 03/09/2019) for Routine Prenatal Appointment.  Prentice Docker, MD, [facility], [facility] 03/02/2019 2:33 PM

## 2019-03-04 LAB — CERVICOVAGINAL ANCILLARY ONLY
Chlamydia: NEGATIVE
Neisseria Gonorrhea: NEGATIVE

## 2019-03-04 LAB — STREP GP B NAA: Strep Gp B NAA: NEGATIVE

## 2019-03-06 ENCOUNTER — Encounter: Payer: BC Managed Care – PPO | Admitting: Advanced Practice Midwife

## 2019-03-09 ENCOUNTER — Other Ambulatory Visit: Payer: Self-pay

## 2019-03-09 ENCOUNTER — Encounter: Payer: Self-pay | Admitting: Obstetrics and Gynecology

## 2019-03-09 ENCOUNTER — Ambulatory Visit (INDEPENDENT_AMBULATORY_CARE_PROVIDER_SITE_OTHER): Payer: BC Managed Care – PPO | Admitting: Obstetrics and Gynecology

## 2019-03-09 VITALS — BP 100/60 | Wt 141.0 lb

## 2019-03-09 DIAGNOSIS — Z3A37 37 weeks gestation of pregnancy: Secondary | ICD-10-CM

## 2019-03-09 DIAGNOSIS — Z3403 Encounter for supervision of normal first pregnancy, third trimester: Secondary | ICD-10-CM

## 2019-03-09 LAB — POCT URINALYSIS DIPSTICK OB: Glucose, UA: NEGATIVE

## 2019-03-09 NOTE — Progress Notes (Signed)
  Routine Prenatal Care Visit  Subjective  Helen Black is a 28 y.o. G1P0000 at [redacted]w[redacted]d being seen today for ongoing prenatal care.  She is currently monitored for the following issues for this low-risk pregnancy and has Female infertility associated with anovulation; Amenorrhea; Delayed gastric emptying; Early satiety; Family history of thyroid disease in sister; and Supervision of normal first pregnancy on their problem list.  ----------------------------------------------------------------------------------- Patient reports no complaints.   Contractions: Not present. Vag. Bleeding: None.  Movement: Present. Denies leaking of fluid.  ----------------------------------------------------------------------------------- The following portions of the patient's history were reviewed and updated as appropriate: allergies, current medications, past family history, past medical history, past social history, past surgical history and problem list. Problem list updated.   Objective  Blood pressure 100/60, weight 141 lb (64 kg), last menstrual period 06/19/2018. Pregravid weight 125 lb (56.7 kg) Total Weight Gain 16 lb (7.258 kg) Urinalysis: Urine Protein    Urine Glucose    Fetal Status: Fetal Heart Rate (bpm): 135 Fundal Height: 36 cm Movement: Present  Presentation: Vertex  General:  Alert, oriented and cooperative. Patient is in no acute distress.  Skin: Skin is warm and dry. No rash noted.   Cardiovascular: Normal heart rate noted  Respiratory: Normal respiratory effort, no problems with respiration noted  Abdomen: Soft, gravid, appropriate for gestational age. Pain/Pressure: Absent     Pelvic:  Cervical exam deferred        Extremities: Normal range of motion.  Edema: None  Mental Status: Normal mood and affect. Normal behavior. Normal judgment and thought content.   Assessment   28 y.o. G1P0000 at [redacted]w[redacted]d by  03/30/2019, by Ultrasound presenting for routine prenatal visit  Plan    pregnancy Problems (from 07/28/18 to present)    Problem Noted Resolved   Supervision of normal first pregnancy 07/28/2018 by Will Bonnet, MD No   Overview Addendum 01/30/2019  9:09 AM by Gae Dry, MD      [facility]inic [facility] Prenatal Labs  Dating  7 wk [place] Blood type: B/Positive/-- (11/11 1127)   Genetic Screen 1 Screen: Negative    Inheritest Negative   Antibody:Negative (11/11 1127)  Anatomic [place] Complete 3/19 Rubella: 4.59 (11/11 1127)  Varicella: Immune  GTT 28 wk: normal      RPR: Non Reactive (11/11 1127)   Rhogam  not needed HBsAg: Negative (11/11 1127)   TDaP vaccine   01/30/2019 HIV: Non Reactive (11/11 1127)   Flu Shot   06/2018                          GBS:   Contraception   unsure Pap: 2019 NIL  CBB    no   CS/VBAC   n/a   Baby Food   breast   Support Person    Husband               Term labor symptoms and general obstetric precautions including but not limited to vaginal bleeding, contractions, leaking of fluid and fetal movement were reviewed in detail with the patient. Please refer to After Visit Summary for other counseling recommendations.   Return in about 1 week (around 03/16/2019) for Routine Prenatal Appointment (may make next couple of so appts).  Prentice Docker, MD, [facility], [facility] 03/09/2019 2:11 PM

## 2019-03-09 NOTE — Addendum Note (Signed)
Addended by: Quintella Baton D on: 03/09/2019 02:16 PM   Modules accepted: Orders

## 2019-03-09 NOTE — Patient Instructions (Signed)

## 2019-03-16 ENCOUNTER — Ambulatory Visit (INDEPENDENT_AMBULATORY_CARE_PROVIDER_SITE_OTHER): Payer: BC Managed Care – PPO

## 2019-03-16 ENCOUNTER — Ambulatory Visit (INDEPENDENT_AMBULATORY_CARE_PROVIDER_SITE_OTHER): Payer: BC Managed Care – PPO | Admitting: Obstetrics and Gynecology

## 2019-03-16 ENCOUNTER — Encounter: Payer: Self-pay | Admitting: Obstetrics and Gynecology

## 2019-03-16 ENCOUNTER — Other Ambulatory Visit: Payer: Self-pay

## 2019-03-16 VITALS — BP 104/70 | Wt 141.0 lb

## 2019-03-16 DIAGNOSIS — O26843 Uterine size-date discrepancy, third trimester: Secondary | ICD-10-CM

## 2019-03-16 DIAGNOSIS — Z3A38 38 weeks gestation of pregnancy: Secondary | ICD-10-CM

## 2019-03-16 DIAGNOSIS — Z3403 Encounter for supervision of normal first pregnancy, third trimester: Secondary | ICD-10-CM

## 2019-03-16 LAB — POCT URINALYSIS DIPSTICK OB: Glucose, UA: NEGATIVE

## 2019-03-16 NOTE — Progress Notes (Signed)
ROB No concerning  Denies lof, no vb, Good FM Desires cervical check

## 2019-03-16 NOTE — Progress Notes (Signed)
    Routine Prenatal Care Visit  Subjective  Helen Black is a 28 y.o. G1P0000 at [redacted]w[redacted]d being seen today for ongoing prenatal care.  She is currently monitored for the following issues for this low-risk pregnancy and has Female infertility associated with anovulation; Amenorrhea; Delayed gastric emptying; Early satiety; Family history of thyroid disease in sister; and Supervision of normal first pregnancy on their problem list.  ----------------------------------------------------------------------------------- Patient reports no complaints.   Contractions: Not present. Vag. Bleeding: None.  Movement: Present. Denies leaking of fluid.  ----------------------------------------------------------------------------------- The following portions of the patient's history were reviewed and updated as appropriate: allergies, current medications, past family history, past medical history, past social history, past surgical history and problem list. Problem list updated.   Objective  Blood pressure 104/70, weight 141 lb (64 kg), last menstrual period 06/19/2018. Pregravid weight 125 lb (56.7 kg) Total Weight Gain 16 lb (7.258 kg) Urinalysis:      Fetal Status: Fetal Heart Rate (bpm): 140 Fundal Height: 33 cm Movement: Present     General:  Alert, oriented and cooperative. Patient is in no acute distress.  Skin: Skin is warm and dry. No rash noted.   Cardiovascular: Normal heart rate noted  Respiratory: Normal respiratory effort, no problems with respiration noted  Abdomen: Soft, gravid, appropriate for gestational age. Pain/Pressure: Absent     Pelvic:  Cervical exam performed Dilation: 1 Effacement (%): 0 Station: -3  Extremities: Normal range of motion.  Edema: None  Mental Status: Normal mood and affect. Normal behavior. Normal judgment and thought content.     Assessment   28 y.o. G1P0000 at [redacted]w[redacted]d by  03/30/2019, by Ultrasound presenting for routine prenatal visit  Plan   pregnancy  Problems (from 07/28/18 to present)    Problem Noted Resolved   Supervision of normal first pregnancy 07/28/2018 by Will Bonnet, MD No   Overview Addendum 01/30/2019  9:09 AM by Gae Dry, MD      [facility] Prenatal Labs  Dating  7 wk [place] Blood type: B/Positive/-- (11/11 1127)   Genetic Screen 1 Screen: Negative    Inheritest Negative   Antibody:Negative (11/11 1127)  Anatomic [place] Complete 3/19 Rubella: 4.59 (11/11 1127)  Varicella: Immune  GTT 28 wk: normal      RPR: Non Reactive (11/11 1127)   Rhogam  not needed HBsAg: Negative (11/11 1127)   TDaP vaccine   01/30/2019 HIV: Non Reactive (11/11 1127)   Flu Shot   06/2018                          GBS:   Contraception   unsure Pap: 2019 NIL  CBB    no   CS/VBAC   n/a   Baby Food   breast   Support Person    Husband               Gestational age appropriate obstetric precautions including but not limited to vaginal bleeding, contractions, leaking of fluid and fetal movement were reviewed in detail with the patient.    [place] for growth  Return for [place] later today, ROB 1 week.  Homero Fellers MD [facility], [facility] 03/16/2019, 12:22 PM

## 2019-03-23 ENCOUNTER — Encounter: Payer: Self-pay | Admitting: Obstetrics and Gynecology

## 2019-03-23 ENCOUNTER — Other Ambulatory Visit: Payer: Self-pay

## 2019-03-23 ENCOUNTER — Ambulatory Visit (INDEPENDENT_AMBULATORY_CARE_PROVIDER_SITE_OTHER): Payer: BC Managed Care – PPO | Admitting: Obstetrics and Gynecology

## 2019-03-23 VITALS — BP 110/62 | Wt 140.0 lb

## 2019-03-23 DIAGNOSIS — Z3A39 39 weeks gestation of pregnancy: Secondary | ICD-10-CM

## 2019-03-23 DIAGNOSIS — Z3403 Encounter for supervision of normal first pregnancy, third trimester: Secondary | ICD-10-CM

## 2019-03-23 LAB — POCT URINALYSIS DIPSTICK OB
Glucose, UA: NEGATIVE
POC,PROTEIN,UA: NEGATIVE

## 2019-03-23 NOTE — Progress Notes (Signed)
ROB No concerns Desires cervical check/ Menbrane sweep

## 2019-03-23 NOTE — Progress Notes (Signed)
    Routine Prenatal Care Visit  Subjective  Helen Black is a 28 y.o. G1P0000 at [redacted]w[redacted]d being seen today for ongoing prenatal care.  She is currently monitored for the following issues for this low-risk pregnancy and has Female infertility associated with anovulation; Amenorrhea; Delayed gastric emptying; Early satiety; Family history of thyroid disease in sister; and Supervision of normal first pregnancy on their problem list.  ----------------------------------------------------------------------------------- Patient reports no complaints.   Contractions: Not present. Vag. Bleeding: None.  Movement: Present. Denies leaking of fluid.  ----------------------------------------------------------------------------------- The following portions of the patient's history were reviewed and updated as appropriate: allergies, current medications, past family history, past medical history, past social history, past surgical history and problem list. Problem list updated.   Objective  Blood pressure 110/62, weight 140 lb (63.5 kg), last menstrual period 06/19/2018. Pregravid weight 125 lb (56.7 kg) Total Weight Gain 15 lb (6.804 kg) Urinalysis:      Fetal Status: Fetal Heart Rate (bpm): 120 Fundal Height: 36 cm Movement: Present  Presentation: Vertex  General:  Alert, oriented and cooperative. Patient is in no acute distress.  Skin: Skin is warm and dry. No rash noted.   Cardiovascular: Normal heart rate noted  Respiratory: Normal respiratory effort, no problems with respiration noted  Abdomen: Soft, gravid, appropriate for gestational age. Pain/Pressure: Absent     Pelvic:  Cervical exam performed Dilation: 1 Effacement (%): 20 Station: -3  Extremities: Normal range of motion.     Mental Status: Normal mood and affect. Normal behavior. Normal judgment and thought content.     Assessment   28 y.o. G1P0000 at [redacted]w[redacted]d by  03/30/2019, by Ultrasound presenting for routine prenatal visit  Plan    pregnancy Problems (from 07/28/18 to present)    Problem Noted Resolved   Supervision of normal first pregnancy 07/28/2018 by Will Bonnet, MD No   Overview Addendum 03/23/2019  8:19 AM by Homero Fellers, MD      [facility] Prenatal Labs  Dating  7 wk [place] Blood type: B/Positive/-- (11/11 1127)   Genetic Screen 1 Screen: Negative    Inheritest Negative   Antibody:Negative (11/11 1127)  Anatomic [place] Complete 3/19 Rubella: 4.59 (11/11 1127)  Varicella: Immune  GTT 28 wk: normal      RPR: Non Reactive (11/11 1127)   Rhogam  not needed HBsAg: Negative (11/11 1127)   TDaP vaccine   01/30/2019 HIV: Non Reactive (11/11 1127)   Flu Shot   06/2018                          GBS:  Negative  Contraception   unsure Pap: 2019 NIL  CBB    no   CS/VBAC   n/a   Baby Food   breast   Support Person    Husband               Gestational age appropriate obstetric precautions including but not limited to vaginal bleeding, contractions, leaking of fluid and fetal movement were reviewed in detail with the patient.    Membranes swept at maternal request Discussed signs and symptoms of labor.   Return in about 1 week (around 03/30/2019) for ROB.  Homero Fellers MD [facility] 03/23/2019, 8:44 AM

## 2019-03-30 ENCOUNTER — Other Ambulatory Visit: Payer: Self-pay

## 2019-03-30 ENCOUNTER — Ambulatory Visit (INDEPENDENT_AMBULATORY_CARE_PROVIDER_SITE_OTHER): Payer: BC Managed Care – PPO | Admitting: Certified Nurse Midwife

## 2019-03-30 VITALS — BP 98/64 | Wt 138.2 lb

## 2019-03-30 DIAGNOSIS — O480 Post-term pregnancy: Secondary | ICD-10-CM

## 2019-03-30 DIAGNOSIS — Z3A40 40 weeks gestation of pregnancy: Secondary | ICD-10-CM

## 2019-03-30 DIAGNOSIS — Z3403 Encounter for supervision of normal first pregnancy, third trimester: Secondary | ICD-10-CM

## 2019-03-30 LAB — POCT URINALYSIS DIPSTICK OB
Glucose, UA: NEGATIVE
POC,PROTEIN,UA: NEGATIVE

## 2019-03-30 NOTE — Progress Notes (Signed)
ROB- no concerns 

## 2019-03-30 NOTE — Progress Notes (Signed)
ROB at 40 weeks. Baby is active. Having some BH contractions. Had some spotting after last week's exam. Cervix 1/50%/-1/ vertex Labor precautions IOL for postdates scheduled for 7/19 Discussed use of Cytotec, foley bulbs, PItocin for [place], CNM

## 2019-04-01 ENCOUNTER — Observation Stay
Admission: EM | Admit: 2019-04-01 | Discharge: 2019-04-01 | Disposition: A | Discharge status: Home / Self Care | Payer: BC Managed Care – PPO | Source: Home / Self Care | Admitting: Obstetrics and Gynecology

## 2019-04-01 ENCOUNTER — Inpatient Hospital Stay
Admission: EM | Admit: 2019-04-01 | Discharge: 2019-04-03 | DRG: 768 | Disposition: A | Discharge status: Home / Self Care | Payer: BC Managed Care – PPO | Attending: Certified Nurse Midwife | Admitting: Certified Nurse Midwife

## 2019-04-01 ENCOUNTER — Inpatient Hospital Stay: Payer: BC Managed Care – PPO | Admitting: Anesthesiology

## 2019-04-01 ENCOUNTER — Other Ambulatory Visit: Payer: Self-pay

## 2019-04-01 DIAGNOSIS — D62 Acute posthemorrhagic anemia: Secondary | ICD-10-CM | POA: Diagnosis not present

## 2019-04-01 DIAGNOSIS — K3184 Gastroparesis: Secondary | ICD-10-CM | POA: Diagnosis present

## 2019-04-01 DIAGNOSIS — Z3A40 40 weeks gestation of pregnancy: Secondary | ICD-10-CM

## 2019-04-01 DIAGNOSIS — O9081 Anemia of the puerperium: Secondary | ICD-10-CM | POA: Diagnosis not present

## 2019-04-01 DIAGNOSIS — O26893 Other specified pregnancy related conditions, third trimester: Secondary | ICD-10-CM | POA: Diagnosis present

## 2019-04-01 DIAGNOSIS — O480 Post-term pregnancy: Secondary | ICD-10-CM | POA: Diagnosis not present

## 2019-04-01 DIAGNOSIS — Z1159 Encounter for screening for other viral diseases: Secondary | ICD-10-CM

## 2019-04-01 LAB — CBC
HCT: 40.1 % (ref 36.0–46.0)
Hemoglobin: 13.8 g/dL (ref 12.0–15.0)
MCH: 30.5 pg (ref 26.0–34.0)
MCHC: 34.4 g/dL (ref 30.0–36.0)
MCV: 88.5 fL (ref 80.0–100.0)
Platelets: 270 10*3/uL (ref 150–400)
RBC: 4.53 MIL/uL (ref 3.87–5.11)
RDW: 13.5 % (ref 11.5–15.5)
WBC: 10.4 10*3/uL (ref 4.0–10.5)
nRBC: 0 % (ref 0.0–0.2)

## 2019-04-01 LAB — TYPE AND SCREEN
ABO/RH(D): B POS
Antibody Screen: NEGATIVE

## 2019-04-01 LAB — SARS CORONAVIRUS 2 BY RT PCR (HOSPITAL ORDER, PERFORMED IN ~~LOC~~ HOSPITAL LAB): SARS Coronavirus 2: NEGATIVE

## 2019-04-01 MED ORDER — LIDOCAINE HCL (PF) 1 % IJ SOLN
INTRAMUSCULAR | Status: DC | PRN
  Administered 2019-04-01: 3 mL

## 2019-04-01 MED ORDER — LIDOCAINE HCL (PF) 1 % IJ SOLN
INTRAMUSCULAR | Status: AC
  Filled 2019-04-01: qty 30

## 2019-04-01 MED ORDER — OXYTOCIN 40 UNITS IN NORMAL SALINE INFUSION - SIMPLE MED
2.5000 [IU]/h | INTRAVENOUS | Status: DC
  Filled 2019-04-01: qty 1000

## 2019-04-01 MED ORDER — DIPHENHYDRAMINE HCL 50 MG/ML IJ SOLN: 12.5000 mg | INTRAMUSCULAR | Status: DC | PRN

## 2019-04-01 MED ORDER — LIDOCAINE-EPINEPHRINE (PF) 1.5 %-1:200000 IJ SOLN
INTRAMUSCULAR | Status: DC | PRN
  Administered 2019-04-01: 3 mL via PERINEURAL

## 2019-04-01 MED ORDER — LACTATED RINGERS IV SOLN: 500.0000 mL | Freq: Once | INTRAVENOUS | Status: DC

## 2019-04-01 MED ORDER — OXYTOCIN 10 UNIT/ML IJ SOLN
INTRAMUSCULAR | Status: AC
  Filled 2019-04-01: qty 2

## 2019-04-01 MED ORDER — AMMONIA AROMATIC IN INHA: 0.3000 mL | Freq: Once | RESPIRATORY_TRACT | Status: DC | PRN

## 2019-04-01 MED ORDER — LIDOCAINE HCL (PF) 1 % IJ SOLN: INTRAMUSCULAR | Status: DC | PRN

## 2019-04-01 MED ORDER — ACETAMINOPHEN 325 MG PO TABS: 650.0000 mg | ORAL_TABLET | ORAL | Status: DC | PRN

## 2019-04-01 MED ORDER — OXYTOCIN BOLUS FROM INFUSION
500.0000 mL | Freq: Once | INTRAVENOUS | Status: AC
  Administered 2019-04-01: 500 mL via INTRAVENOUS

## 2019-04-01 MED ORDER — FENTANYL 2.5 MCG/ML W/ROPIVACAINE 0.15% IN NS 100 ML EPIDURAL (ARMC)
EPIDURAL | Status: DC | PRN
  Administered 2019-04-01: 12 mL/h via EPIDURAL

## 2019-04-01 MED ORDER — MISOPROSTOL 200 MCG PO TABS: 800.0000 ug | ORAL_TABLET | Freq: Once | ORAL | Status: DC | PRN

## 2019-04-01 MED ORDER — LACTATED RINGERS IV SOLN
INTRAVENOUS | Status: DC
  Administered 2019-04-01: 17:00:00 via INTRAVENOUS

## 2019-04-01 MED ORDER — PHENYLEPHRINE 40 MCG/ML (10ML) SYRINGE FOR IV PUSH (FOR BLOOD PRESSURE SUPPORT)
80.0000 ug | PREFILLED_SYRINGE | INTRAVENOUS | Status: DC | PRN
  Filled 2019-04-01: qty 10

## 2019-04-01 MED ORDER — LIDOCAINE HCL (PF) 1 % IJ SOLN: 30.0000 mL | INTRAMUSCULAR | Status: DC | PRN

## 2019-04-01 MED ORDER — LACTATED RINGERS IV SOLN: 500.0000 mL | INTRAVENOUS | Status: DC | PRN

## 2019-04-01 MED ORDER — MISOPROSTOL 200 MCG PO TABS
ORAL_TABLET | ORAL | Status: AC
  Filled 2019-04-01: qty 4

## 2019-04-01 MED ORDER — AMMONIA AROMATIC IN INHA
RESPIRATORY_TRACT | Status: AC
  Filled 2019-04-01: qty 10

## 2019-04-01 MED ORDER — FENTANYL 2.5 MCG/ML W/ROPIVACAINE 0.15% IN NS 100 ML EPIDURAL (ARMC)
EPIDURAL | Status: AC
  Filled 2019-04-01: qty 100

## 2019-04-01 MED ORDER — BUTORPHANOL TARTRATE 1 MG/ML IJ SOLN
1.0000 mg | INTRAMUSCULAR | Status: DC | PRN
  Administered 2019-04-01: 18:00:00 1 mg via INTRAVENOUS
  Filled 2019-04-01: qty 1

## 2019-04-01 MED ORDER — BUPIVACAINE HCL (PF) 0.25 % IJ SOLN
INTRAMUSCULAR | Status: DC | PRN
  Administered 2019-04-01: 10 mL via EPIDURAL

## 2019-04-01 MED ORDER — FENTANYL 2.5 MCG/ML W/ROPIVACAINE 0.15% IN NS 100 ML EPIDURAL (ARMC): 12.0000 mL/h | EPIDURAL | Status: DC

## 2019-04-01 MED ORDER — EPHEDRINE 5 MG/ML INJ
10.0000 mg | INTRAVENOUS | Status: DC | PRN
  Filled 2019-04-01: qty 2

## 2019-04-01 NOTE — Anesthesia Preprocedure Evaluation (Signed)
Anesthesia Evaluation  Patient identified by MRN, date of birth, ID band Patient awake    Reviewed: Allergy & Precautions, NPO status , Patient's Chart, lab work & pertinent test results  Airway Mallampati: II  TM Distance: >3 FB     Dental   Pulmonary neg pulmonary ROS,    Pulmonary exam normal        Cardiovascular negative cardio ROS Normal cardiovascular exam     Neuro/Psych negative neurological ROS  negative psych ROS   GI/Hepatic Neg liver ROS, gastroparesis   Endo/Other  negative endocrine ROS  Renal/GU negative Renal ROS  negative genitourinary   Musculoskeletal   Abdominal Normal abdominal exam  (+)   Peds negative pediatric ROS (+)  Hematology negative hematology ROS (+)   Anesthesia Other Findings Past Medical History: No date: Gastroparesis No date: History of fasciotomy  Reproductive/Obstetrics (+) Pregnancy                             Anesthesia Physical Anesthesia Plan  ASA: II  Anesthesia Plan: Epidural   Post-op Pain Management:    Induction:   PONV Risk Score and Plan:   Airway Management Planned: Natural Airway  Additional Equipment:   Intra-op Plan:   Post-operative Plan:   Informed Consent: I have reviewed the patients History and Physical, chart, labs and discussed the procedure including the risks, benefits and alternatives for the proposed anesthesia with the patient or authorized representative who has indicated his/her understanding and acceptance.     Dental advisory given  Plan Discussed with: CRNA and Surgeon  Anesthesia Plan Comments:         Anesthesia Quick Evaluation

## 2019-04-01 NOTE — Discharge Instructions (Signed)

## 2019-04-01 NOTE — H&P (Signed)
OB History & Physical   History of Present Illness:  Chief Complaint:  Strong regular contractions HPI:  Helen Black is a 28 y.o. G25P0000 female with EDC=03/30/2019 at [redacted]w[redacted]d dated by a 7wk1d ultrasound.  Her pregnancy has been complicated by gastroparesis. She conceived on letrozole.  She presents to L&D for evaluation of labor. She was seen early this morning for labor contractions, but was sent home when her cervix remained 1cm/50%. Her contractions increased in intensity around 1200 noon. No leaking of fluid or vaginal bleeding..    Prenatal care site: Prenatal care at [facility] has been remarkable for a 15# weight gain and the following:  [facility] Prenatal Labs  Dating  7 wk [place] Blood type: B/Positive/-- (11/11 1127)   Genetic Screen 1 Screen: Negative    Inheritest Negative   Antibody:Negative (11/11 1127)  Anatomic [place] Complete 3/19 Rubella: 4.59 (11/11 1127)  Varicella: Immune  GTT 28 wk: normal      RPR: Non Reactive (11/11 1127)   Rhogam  not needed HBsAg: Negative (11/11 1127)   TDaP vaccine   01/30/2019 HIV: Non Reactive (11/11 1127)   Flu Shot   06/2018                          GBS:  Negative  Contraception   unsure Pap: 2019 NIL  CBB    no   CS/VBAC   n/a   Baby Food   breast   Support Person    Husband         Maternal Medical History:   Past Medical History:  Diagnosis Date  . Gastroparesis   . History of fasciotomy     Past Surgical History:  Procedure Laterality Date  . ANTERIOR COMPARTMENT DECOMPRESSION      No Known Allergies  Prior to Admission medications   Medication Sig Start Date End Date Taking? Authorizing Provider  PRENATAL 28-0.8 MG TABS Take by mouth.   Yes [provider]    Social History: She  reports that she has never smoked. She has never used smokeless tobacco. She reports current alcohol use. She reports that she does not use drugs.  Family History: family history includes Hypothyroidism in her  sister.   Review of Systems: Negative x 10 systems reviewed except as noted in the HPI.      Physical Exam:  Vital Signs: BP 119/85 (BP Location: Right Arm)   Pulse (!) 103   Temp 98.2 F (36.8 C) (Oral)   Resp 18   Ht 5\' 7"  (1.702 m)   Wt 63.5 kg   LMP 06/19/2018 (Approximate)   BMI 21.93 kg/m  General: grimacing with contractions and griping side rails. HEENT: normocephalic, atraumatic Heart: regular rate & rhythm.  No murmurs/rubs/gallops Lungs: clear to auscultation bilaterally Abdomen: soft, gravid, tender with contractions;  EFW: 6 1/2# Pelvic:   External: Normal external female genitalia  Cervix: Dilation: 3.5 / Effacement (%): 90, 100 / Station: -1 per RN exam  Extremities: non-tender, symmetric, no edema bilaterally.  DTRs: +2  Neurologic: Alert & oriented x 3.    Baseline FHR: 135 baseline with accelerations to 150s to 170, moderate variability Toco: contractions every 4 minutes  Bedside Ultrasound: cephalic/ LOA    Assessment:  Helen Black is a 28 y.o. G1P0000 female at [redacted]w[redacted]d in labor  Plan:  1. Admit to Labor & Delivery -anticipate vaginal delivery 2. CBC, T&S, Clrs, IVF 3. GBS negative.  4. Consents obtained. 5. Stadol for pain, epidural when available 6. Covid test done-awaiting results 7. B POS/ RI/ VI 8. TDAP 02/04/19 9. Breast 10. Contraception-unsure   Farrel ConnersColleen Shoshanna Mcquitty  04/01/2019 5:40 PM

## 2019-04-01 NOTE — Final Progress Note (Signed)
Physician Final Progress Note  Patient ID: Helen Black MRN: [id number] DOB/AGE: 1991/01/13 28 y.o.  Admit date: 04/01/2019 Admitting provider: Malachy Mood, MD/ Jesus Genera. Danise Mina, CNM Discharge date: 04/01/2019   Admission Diagnoses: IUP at 40.2 weeks with contractions  Discharge Diagnoses:  IUP at 40.2 weeks with false vs early labor  Consults: None  Significant Findings/ Diagnostic Studies:  Helen Black is a 28 y.o. G40P0000 female with EDC=03/30/2019 at [redacted]w[redacted]d dated by a 7wk1d ultrasound.  Her pregnancy has been complicated by gastroparesis. She conceived on letrozole.  She presented to L&D for evaluation of labor.   No leaking of fluid or vaginal bleeding..    Prenatal care site: Prenatal care at [facility] has been remarkable for a 15# weight gain and the following:  [facility] Prenatal Labs  Dating  7 wk [place] Blood type: B/Positive/-- (11/11 1127)   Genetic Screen 1 Screen: Negative    Inheritest Negative   Antibody:Negative (11/11 1127)  Anatomic [place] Complete 3/19 Rubella: 4.59 (11/11 1127)  Varicella: Immune  GTT 28 wk: normal      RPR: Non Reactive (11/11 1127)   Rhogam  not needed HBsAg: Negative (11/11 1127)   TDaP vaccine   01/30/2019 HIV: Non Reactive (11/11 1127)   Flu Shot   06/2018                          GBS:  Negative  Contraception   unsure Pap: 2019 NIL  CBB    no   CS/VBAC   n/a   Baby Food   breast   Support Person    Husband    Exam:   Vital signs: 98.3-73-18 BP 110/77  General: gravid WF appears comfortable  FHR: 120 baseline with accelerations to 150s, moderate variability  Toco: contractions mild, irregular. Inconsistent in strength and duration  Cervix: 1/50%-no change after several hours.  A: IUP at 40.2 weeks in early vs false labor Cat 1 tracing P: Discharged home with labor precautions Is scheduled for IOL on 7/19 if NIL   Procedures: none  Discharge Condition: stable  Disposition:    Diet:  Regular diet  Discharge Activity: Activity as tolerated   Allergies as of 04/01/2019   No Known Allergies     Medication List    TAKE these medications   Prenatal 28-0.8 MG Tabs Take by mouth.        Total time spent taking care of this patient: 15 minutes  Signed: Dalia Heading 04/01/2019, 6:06 PM

## 2019-04-01 NOTE — Anesthesia Procedure Notes (Signed)
Epidural Patient location during procedure: OB Start time: 04/01/2019 5:59 PM End time: 04/01/2019 6:14 PM  Staffing Anesthesiologist: Alvin Critchley, MD Performed: anesthesiologist   Preanesthetic Checklist Completed: patient identified, site marked, surgical consent, pre-op evaluation, timeout performed, IV checked, risks and benefits discussed and monitors and equipment checked  Epidural Patient position: sitting Prep: Betadine Patient monitoring: heart rate, continuous pulse ox and blood pressure Approach: midline Location: L3-L4 Injection technique: LOR air  Needle:  Needle type: Tuohy  Needle gauge: 17 G Needle length: 9 cm and 9 Catheter type: closed end flexible Catheter size: 19 Gauge Test dose: negative and 1.5% lidocaine with Epi 1:200 K  Assessment Sensory level: T8 Events: blood not aspirated, injection not painful, no injection resistance, negative IV test and no paresthesia  Additional Notes Time out called.  Patient placed in sitting position.  Back prepped and draped in sterile fashion.  A skin wheal was made in the L3-L4 interspace with 1% Lidocaine plain.  A 17G Tuohy needle was advanced to the epidural space by a loss of resistance technique.  The catheter was threaded 3 cm and the TD was negative .  No blood or fluid or paresthesias .  The patient tolerated the procedure well and the catheter was affixed to the back in sterile fashion.                  Reason for block:procedure for pain

## 2019-04-02 ENCOUNTER — Encounter: Payer: Self-pay | Admitting: Certified Nurse Midwife

## 2019-04-02 MED ORDER — SIMETHICONE 80 MG PO CHEW: 80.0000 mg | CHEWABLE_TABLET | ORAL | Status: DC | PRN

## 2019-04-02 MED ORDER — WITCH HAZEL-GLYCERIN EX PADS
1.0000 "application " | MEDICATED_PAD | CUTANEOUS | Status: DC | PRN
  Filled 2019-04-02: qty 100

## 2019-04-02 MED ORDER — COCONUT OIL OIL
1.0000 "application " | TOPICAL_OIL | Status: DC | PRN
  Administered 2019-04-02: 1 via TOPICAL
  Filled 2019-04-02: qty 120

## 2019-04-02 MED ORDER — FERROUS SULFATE 325 (65 FE) MG PO TABS
325.0000 mg | ORAL_TABLET | Freq: Every day | ORAL | Status: DC
  Administered 2019-04-02 – 2019-04-03 (×2): 325 mg via ORAL
  Filled 2019-04-02 (×2): qty 1

## 2019-04-02 MED ORDER — SENNOSIDES-DOCUSATE SODIUM 8.6-50 MG PO TABS
2.0000 | ORAL_TABLET | ORAL | Status: DC
  Administered 2019-04-03: 09:00:00 2 via ORAL
  Filled 2019-04-02: qty 2

## 2019-04-02 MED ORDER — OXYCODONE HCL 5 MG PO TABS: 5.0000 mg | ORAL_TABLET | ORAL | Status: DC | PRN

## 2019-04-02 MED ORDER — IBUPROFEN 600 MG PO TABS
600.0000 mg | ORAL_TABLET | Freq: Four times a day (QID) | ORAL | Status: DC
  Administered 2019-04-02 – 2019-04-03 (×6): 600 mg via ORAL
  Filled 2019-04-02 (×6): qty 1

## 2019-04-02 MED ORDER — DIBUCAINE (PERIANAL) 1 % EX OINT: 1.0000 "application " | TOPICAL_OINTMENT | CUTANEOUS | Status: DC | PRN

## 2019-04-02 MED ORDER — BENZOCAINE-MENTHOL 20-0.5 % EX AERO
1.0000 "application " | INHALATION_SPRAY | CUTANEOUS | Status: DC | PRN
  Filled 2019-04-02: qty 56

## 2019-04-02 MED ORDER — OXYTOCIN 40 UNITS IN NORMAL SALINE INFUSION - SIMPLE MED
INTRAVENOUS | Status: AC
  Filled 2019-04-02: qty 1000

## 2019-04-02 MED ORDER — PRENATAL MULTIVITAMIN CH
1.0000 | ORAL_TABLET | Freq: Every day | ORAL | Status: DC
  Administered 2019-04-02 – 2019-04-03 (×2): 1 via ORAL
  Filled 2019-04-02 (×2): qty 1

## 2019-04-02 MED ORDER — ONDANSETRON HCL 4 MG PO TABS: 4.0000 mg | ORAL_TABLET | ORAL | Status: DC | PRN

## 2019-04-02 MED ORDER — ONDANSETRON HCL 4 MG/2ML IJ SOLN: 4.0000 mg | INTRAMUSCULAR | Status: DC | PRN

## 2019-04-02 NOTE — Lactation Note (Signed)
This note was copied from a baby's chart. Lactation Consultation Note  Patient Name: Helen Black KYHCW'C Date: 04/02/2019 Reason for consult: Initial assessment   Maternal Data    Feeding    LATCH Score Latch: Repeated attempts needed to sustain latch, nipple held in mouth throughout feeding, stimulation needed to elicit sucking reflex.  Audible Swallowing: Spontaneous and intermittent  Type of Nipple: Everted at rest and after stimulation  Comfort (Breast/Nipple): Soft / non-tender  Hold (Positioning): No assistance needed to correctly position infant at breast.  LATCH Score: 9  Interventions Interventions: Breast feeding basics reviewed  Lactation Tools Discussed/Used     Consult Status Consult Status: PRN Follow-up type: In-patient    Daryel November 04/02/2019, 1:52 PM

## 2019-04-02 NOTE — Progress Notes (Signed)
PPD#1 SVD Subjective:  Resting in bed, cheerful and conversant. Pain control is adequate with medications, mostly only has pain when standing up. Voiding without difficulty. Tolerating a regular diet. Ambulating well.  Objective:   Blood pressure 109/69, pulse 75, temperature 98.3 F (36.8 C), temperature source Oral, resp. rate 18, height 5\' 7"  (1.702 m), weight 63.5 kg, last menstrual period 06/19/2018, SpO2 99 %,  currently breastfeeding.  General: NAD Pulmonary: no increased work of breathing Abdomen: non-distended, non-tender Uterus:  fundus firm; lochia appropriate Laceration: Well-approximated, sutures intact Extremities: no edema, no erythema, no tenderness, no signs of DVT  Results for orders placed or performed during the hospital encounter of 04/01/19 (from the past 72 hour(s))  Type and screen [facility]     Status: None   Collection Time: 04/01/19  4:43 PM  Result Value Ref Range   ABO/RH(D) B POS    Antibody Screen NEG    Sample Expiration      04/04/2019,2359 Performed at [facility], [address] [place], [place] [address]   SARS Coronavirus 2 (CEPHEID - Performed in [facility] lab), Hosp Order     Status: None   Collection Time: 04/01/19  4:44 PM   Specimen: Nasopharyngeal Swab  Result Value Ref Range   SARS Coronavirus 2 NEGATIVE NEGATIVE    Comment: (NOTE) If result is NEGATIVE SARS-CoV-2 target nucleic acids are NOT DETECTED. The SARS-CoV-2 RNA is generally detectable in upper and lower  respiratory specimens during the acute phase of infection. The lowest  concentration of SARS-CoV-2 viral copies this assay can detect is 250  copies / mL. A negative result does not preclude SARS-CoV-2 infection  and should not be used as the sole basis for treatment or other  patient management decisions.  A negative result may occur with  improper specimen collection / handling, submission of specimen other  than  nasopharyngeal swab, presence of viral mutation(s) within the  areas targeted by this assay, and inadequate number of viral copies  (<250 copies / mL). A negative result must be combined with clinical  observations, patient history, and epidemiological information. If result is POSITIVE SARS-CoV-2 target nucleic acids are DETECTED. The SARS-CoV-2 RNA is generally detectable in upper and lower  respiratory specimens dur ing the acute phase of infection.  Positive  results are indicative of active infection with SARS-CoV-2.  Clinical  correlation with patient history and other diagnostic information is  necessary to determine patient infection status.  Positive results do  not rule out bacterial infection or co-infection with other viruses. If result is PRESUMPTIVE POSTIVE SARS-CoV-2 nucleic acids MAY BE PRESENT.   A presumptive positive result was obtained on the submitted specimen  and confirmed on repeat testing.  While 2019 novel coronavirus  (SARS-CoV-2) nucleic acids may be present in the submitted sample  additional confirmatory testing may be necessary for epidemiological  and / or clinical management purposes  to differentiate between  SARS-CoV-2 and other Sarbecovirus currently known to infect humans.  If clinically indicated additional testing with an alternate test  methodology [phone]([id number]) is advised. The SARS-CoV-2 RNA is generally  detectable in upper and lower respiratory sp ecimens during the acute  phase of infection. The expected result is Negative. Fact Sheet for Patients:  BoilerBrush.com.cyhttps://www.fda.gov/media/136312/download Fact Sheet for Healthcare Providers: https://pope.com/https://www.fda.gov/media/136313/download This test is not yet approved or cleared by the [place] FDA and has been authorized for detection and/or diagnosis of SARS-CoV-2 by FDA under an Emergency Use Authorization (  EUA).  This EUA will remain in effect (meaning this test can be used) for the duration of  the COVID-19 declaration under Section 564(b)(1) of the Act, 21 U.S.C. section 360bbb-3(b)(1), unless the authorization is terminated or revoked sooner. Performed at [facility], [address] [place], [place] [address]   CBC     Status: None   Collection Time: 04/01/19  4:44 PM  Result Value Ref Range   WBC 10.4 4.0 - 10.5 K/uL   RBC 4.53 3.87 - 5.11 MIL/uL   Hemoglobin 13.8 12.0 - 15.0 g/dL   HCT 40.1 36.0 - 46.0 %   MCV 88.5 80.0 - 100.0 fL   MCH 30.5 26.0 - 34.0 pg   MCHC 34.4 30.0 - 36.0 g/dL   RDW 13.5 11.5 - 15.5 %   Platelets 270 150 - 400 K/uL   nRBC 0.0 0.0 - 0.2 %    Comment: Performed at [facility], [address], [place], [place] [address]    Assessment:   28 y.o. G1P1001 postpartum day # 1 recovering well  Plan:    1) Acute blood loss anemia - hemodynamically stable and asymptomatic - PO ferrous sulfate  2) Blood Type --/--/B POS (07/15 1643) /   3) Rubella 4.59 (11/11 1127) / Varicella Immune / TDAP status: received antepartum 02/04/2019  4) Breastfeeding  5) Contraception: not discussed, undecided per record  6) Disposition: continue postpartum care.  Avel Sensor, CNM 04/02/2019  9:51 AM

## 2019-04-02 NOTE — Discharge Summary (Signed)
Physician Obstetric Discharge Summary  Patient ID: Helen Black MRN: [id number] DOB/AGE: 1991-05-17 28 y.o.   Date of Admission: 04/01/2019 Date of Delivery: 04/01/2019 Delivering Provider: Rod Can, CNM Date of Discharge: 04/03/2019  Admitting Diagnosis: Onset of Labor at [redacted]w[redacted]d  Secondary Diagnosis: none  Mode of Delivery: normal spontaneous vaginal delivery 04/01/2019      Discharge Diagnosis: Term intrauterine pregnancy-delivered. 3A perineal laceration.   Intrapartum Procedures: Atificial rupture of membranes, epidural and Repair of 3A perineal laceration   Post partum procedures: none  Complications: 3rd degree perineal laceration    Brief Hospital Course  Helen Black is a G1P1001 who had a SVD on 04/01/2019;  for further details of this delivery, please refer to the delivery note.  Patient had an uncomplicated postpartum course.  By time of discharge on PPD#2, her pain was controlled on oral pain medications; she had appropriate lochia and was ambulating, voiding without difficulty and tolerating regular diet.  She was deemed stable for discharge to home.     Labs: CBC Latest Ref Rng & Units 04/03/2019 04/01/2019 01/01/2019  WBC 4.0 - 10.5 K/uL 13.1(H) 10.4 6.9  Hemoglobin 12.0 - 15.0 g/dL 12.7 13.8 12.4  Hematocrit 36.0 - 46.0 % 38.3 40.1 35.8  Platelets 150 - 400 K/uL 282 270 245   B POS  Physical exam:  Blood pressure 106/72, pulse 69, temperature 98 F (36.7 C), temperature source Oral, resp. rate 18, height 5\' 7"  (1.702 m), weight 63.5 kg, last menstrual period 06/19/2018, SpO2 100 %, unknown if currently breastfeeding. General: alert and no distress Lochia: appropriate Abdomen: soft, NT Uterine Fundus: firm Incision: NA Extremities: No evidence of DVT seen on physical exam. No lower extremity edema.  Discharge Instructions: Per After Visit Summary. Activity: Advance as tolerated. Pelvic rest for 6 weeks.  Also refer to Discharge Instructions Diet:  Regular Medications: Allergies as of 04/03/2019   No Known Allergies     Medication List    TAKE these medications   Prenatal 28-0.8 MG Tabs Take by mouth.     Stool softener as needed   Outpatient follow up:  Follow-up Information    Dalia Heading, CNM. Schedule an appointment as soon as possible for a visit.   Specialty: Certified Nurse Midwife Why: for 6 week postpartum check Contact information: [address] [place] [address] [phone]          Postpartum contraception: undecided, will discuss at 6 week postpartum visit, reviewed importance of having some form in place prior to having intercourse  Discharged Condition: good  Discharged to: home   Newborn Data: Jeronimo Norma Disposition:home with mother  Apgars: APGAR (1 MIN): 9   APGAR (5 MINS): 9   APGAR (10 MINS): Weight: 2350 g    Baby Feeding: Breast  Rod Can, CNM 04/03/2019 10:37 AM

## 2019-04-02 NOTE — Anesthesia Postprocedure Evaluation (Signed)
Anesthesia Post Note  Patient: Helen Black  Procedure(s) Performed: AN AD HOC LABOR EPIDURAL  Patient location during evaluation: Mother Baby Anesthesia Type: Epidural Level of consciousness: awake and alert Pain management: pain level controlled Vital Signs Assessment: post-procedure vital signs reviewed and stable Respiratory status: spontaneous breathing, nonlabored ventilation and respiratory function stable Cardiovascular status: stable Postop Assessment: no headache, no backache and epidural receding Anesthetic complications: no     Last Vitals:  Vitals:   04/02/19 0300 04/02/19 0408  BP: 116/78 111/69  Pulse: 67 70  Resp: 20   Temp: 37.1 C 37.1 C  SpO2: 98% 100%    Last Pain:  Vitals:   04/02/19 0408  TempSrc: Oral  PainSc:                  [name]

## 2019-04-03 ENCOUNTER — Encounter: Payer: Self-pay | Admitting: *Deleted

## 2019-04-03 LAB — CBC
HCT: 38.3 % (ref 36.0–46.0)
Hemoglobin: 12.7 g/dL (ref 12.0–15.0)
MCH: 30.4 pg (ref 26.0–34.0)
MCHC: 33.2 g/dL (ref 30.0–36.0)
MCV: 91.6 fL (ref 80.0–100.0)
Platelets: 282 10*3/uL (ref 150–400)
RBC: 4.18 MIL/uL (ref 3.87–5.11)
RDW: 14.1 % (ref 11.5–15.5)
WBC: 13.1 10*3/uL — ABNORMAL HIGH (ref 4.0–10.5)
nRBC: 0 % (ref 0.0–0.2)

## 2019-04-03 LAB — RPR: RPR Ser Ql: NONREACTIVE

## 2019-04-03 NOTE — Discharge Instructions (Signed)
°Vaginal Delivery, Care After °Refer to this sheet in the next few weeks. These discharge instructions provide you with information on caring for yourself after delivery. Your caregiver may also give you specific instructions. Your treatment has been planned according to the most current medical practices available, but problems sometimes occur. Call your caregiver if you have any problems or questions after you go home. °HOME CARE INSTRUCTIONS °1. Take over-the-counter or prescription medicines only as directed by your caregiver or pharmacist. °2. Do not drink alcohol, especially if you are breastfeeding or taking medicine to relieve pain. °3. Do not smoke tobacco. °4. Continue to use good perineal care. Good perineal care includes: °1. Wiping your perineum from back to front °2. Keeping your perineum clean. °3. You can do sitz baths twice a day, to help keep this area clean °5. Do not use tampons, douche or have sex for 6 weeks °6. Shower only and avoid sitting in submerged water, aside from sitz baths °7. Wear a well-fitting bra that provides breast support. °8. Eat healthy foods. °9. Drink enough fluids to keep your urine clear or pale yellow. °10. Eat high-fiber foods such as whole grain cereals and breads, brown rice, beans, and fresh fruits and vegetables every day. These foods may help prevent or relieve constipation. °11. Avoid constipation with high fiber foods or medications, such as miralax or metamucil °12. Follow your caregiver's recommendations regarding resumption of activities such as climbing stairs, driving, lifting, exercising, or traveling. °13. Talk to your caregiver about resuming sexual activities. Resumption of sexual activities after 6 weeks is dependent upon your risk of infection, your rate of healing, and your comfort and desire to resume sexual activity. °14. Try to have someone help you with your household activities and your newborn for at least a few days after you leave the  hospital. °15. Rest as much as possible. Try to rest or take a nap when your newborn is sleeping. °16. Increase your activities gradually. °17. Keep all of your scheduled postpartum appointments. It is very important to keep your scheduled follow-up appointments. At these appointments, your caregiver will be checking to make sure that you are healing physically and emotionally. °SEEK MEDICAL CARE IF:  °· You are passing large clots from your vagina. Save any clots to show your caregiver. °· You have a foul smelling discharge from your vagina. °· You have trouble urinating. °· You are urinating frequently. °· You have pain when you urinate. °· You have a change in your bowel movements. °· You have increasing redness, pain, or swelling near your vaginal incision (episiotomy) or vaginal tear. °· You have pus draining from your episiotomy or vaginal tear. °· Your episiotomy or vaginal tear is separating. °· You have painful, hard, or reddened breasts. °· You have a severe headache. °· You have blurred vision or see spots. °· You feel sad or depressed. °· You have thoughts of hurting yourself or your newborn. °· You have questions about your care, the care of your newborn, or medicines. °· You are dizzy or light-headed. °· You have a rash. °· You have nausea or vomiting. °· You were breastfeeding and have not had a menstrual period within 12 weeks after you stopped breastfeeding. °· You are not breastfeeding and have not had a menstrual period by the 12th week after delivery. °· You have a fever of 100.5 or more °SEEK IMMEDIATE MEDICAL CARE IF:  °· You have persistent pain. °· You have chest pain. °· You have shortness   of breath.  You faint.  You have leg pain.  You have stomach pain.  Your vaginal bleeding saturates two or more sanitary pads in 1 hour. MAKE SURE YOU:   Understand these instructions.  Will watch your condition.  Will get help right away if you are not doing well or get worse. Document  Released: 08/31/2000 Document Revised: 01/18/2014 Document Reviewed: 04/30/2012 [facility] Patient Information 2015 [place], [place]. This information is not intended to replace advice given to you by your health care provider. Make sure you discuss any questions you have with your health care provider.  Sitz Bath A sitz bath is a warm water bath taken in the sitting position. The water covers only the hips and butt (buttocks). We recommend using one that fits in the toilet, to help with ease of use and cleanliness. It may be used for either healing or cleaning purposes. Sitz baths are also used to relieve pain, itching, or muscle tightening (spasms). The water may contain medicine. Moist heat will help you heal and relax.  HOME CARE  Take 3 to 4 sitz baths a day. 18. Fill the bathtub half-full with warm water. 19. Sit in the water and open the drain a little. 20. Turn on the warm water to keep the tub half-full. Keep the water running constantly. 21. Soak in the water for 15 to 20 minutes. 22. After the sitz bath, pat the affected area dry. GET HELP RIGHT AWAY IF: You get worse instead of better. Stop the sitz baths if you get worse. MAKE SURE YOU:  Understand these instructions.  Will watch your condition.  Will get help right away if you are not doing well or get worse. Document Released: 10/11/2004 Document Revised: 05/28/2012 Document Reviewed: 01/01/2011 [facility] Patient Information 2015 [place], [place]. This information is not intended to replace advice given to you by your health care provider. Make sure you discuss any questions you have with your health care provider.  Call your doctor for increased pain or vaginal bleeding, temperature above 100.4, depression, or concerns.  No strenuous activity or heavy lifting for 6 weeks.  No intercourse, tampons, or douching for 6 weeks.  No tub baths- showers only.  No driving for 2 weeks.  Increase calories and fluids while breastfeeding.   Continue prenatal vitamin and iron.

## 2019-04-03 NOTE — Lactation Note (Signed)
This note was copied from a baby's chart. Lactation Consultation Note  Patient Name: Helen Black HFSFS'E Date: 04/03/2019 Reason for consult: Follow-up assessment;[place] Mom states she is doing well with latching baby, calm and assured, offered assist or observation with next feeding but mom declined, she states her sister just had her 2nd child and has support from her and husband,, she has a Spectra breast pump, questions answered about pacifier use and pump use  Maternal Data Formula Feeding for Exclusion: No Does the patient have breastfeeding experience prior to this delivery?: No  Feeding Feeding Type: (did not observe a feeding)  LATCH Score                   Interventions    Lactation Tools Discussed/Used WIC Program: No   Consult Status Consult Status: PRN Date: 04/03/19 Follow-up type: In-patient    Ferol Luz 04/03/2019, 12:26 PM

## 2019-04-03 NOTE — Progress Notes (Signed)
Discharge instructions provided.  Parents verbalize understanding of all instructions and follow-up care.  Pt discharged to home with infant at 1307 on 04/03/19 via wheelchair by RN. Reed Breech, RN 04/03/2019

## 2019-05-05 ENCOUNTER — Telehealth: Payer: Self-pay

## 2019-05-05 NOTE — Telephone Encounter (Signed)
FMLA/DISABILITY form for [employer] filled out, signature obtained and given to KT for processing. 

## 2019-05-14 NOTE — Progress Notes (Signed)
Postpartum Visit  Chief Complaint:  Chief Complaint  Patient presents with  . Postpartum Care    History of Present Illness: Helen Black is a 28 y.o. MWF, G1P1001, who presents for her 6 week postpartum visit.  Date of delivery: 04/02/2019 Type of delivery: Vaginal delivery - Vacuum or forceps assisted  no Episiotomy No.  Laceration: yes, 3A perineal laceration and right periurethral Pregnancy or labor problems:  no Any problems since the delivery:  No. She feels like her perineal laceration has healed well. No pain of tenderness. No pain with BM. Bleeding stopped entirely 1 week ago. Breast feeding 8-10 x/day. Has cut daily out of her diet and that has cut down on baby's reflux and she is sleeping better.   Newborn Details:  SINGLETON :  1. Baby's name: Lorenda IshiharaHarper Grace. Birth weight: 6#6.3oz Maternal Details:  Breast Feeding:  yes Post partum depression/anxiety noted:  no Edinburgh Post-Partum Depression Score:  4  Date of last PAP: 01/10/2018  NIL/neg HRHPV   Review of Systems: ROS  Past Medical History:  Past Medical History:  Diagnosis Date  . Gastroparesis   . History of fasciotomy     Past Surgical History:  Past Surgical History:  Procedure Laterality Date  . ANTERIOR COMPARTMENT DECOMPRESSION      Family History:  Family History  Problem Relation Age of Onset  . Hypothyroidism Sister     Social History:  Social History   Socioeconomic History  . Marital status: Married    Spouse name: Matt  . Number of children: Not on file  . Years of education: Not on file  . Highest education level: Not on file  Occupational History  . Not on file  Social Needs  . Financial resource strain: Not hard at all  . Food insecurity    Worry: Never true    Inability: Never true  . Transportation needs    Medical: No    Non-medical: No  Tobacco Use  . Smoking status: Never Smoker  . Smokeless tobacco: Never Used  Substance and Sexual Activity  . Alcohol  use: Yes    Comment: occasionally  . Drug use: No  . Sexual activity: Yes    Birth control/protection: None  Lifestyle  . Physical activity    Days per week: 3 days    Minutes per session: 20 min  . Stress: Not at all  Relationships  . Social Musicianconnections    Talks on phone: Three times a week    Gets together: Three times a week    Attends religious service: 1 to 4 times per year    Active member of club or organization: Yes    Attends meetings of clubs or organizations: Never    Relationship status: Married  . Intimate partner violence    Fear of current or ex partner: No    Emotionally abused: No    Physically abused: No    Forced sexual activity: No  Other Topics Concern  . Not on file  Social History Narrative  . Not on file    Allergies:  No Known Allergies  Medications: Prior to Admission medications   Medication Sig Start Date End Date Taking? Authorizing Provider  PRENATAL 28-0.8 MG TABS Take by mouth.    [provider]    Physical Exam Vitals: BP (!) 80/60   Pulse 60   Ht 5\' 7"  (1.702 m)   Wt 117 lb (53.1 kg)   LMP  (  LMP Unknown)   Breastfeeding Yes   BMI 18.32 kg/m  General: WF in NAD HEENT: normocephalic, anicteric Neck: No thyroid enlargement, no palpable nodules, no cervical lymphadenpathy Breast: Lactating, no inflammation, no masses, nipples intact Pulmonary: No increased work of breathing, CTAB Heart: RRR without murmur Abdomen: Soft, non-tender, non-distended.  Umbilicus without lesions.  No hepatomegaly or masses palpable. No evidence of hernia. Genitourinary:  External: Well healed perineum, no lesions or inflammation    Vagina: Normal vaginal mucosa, no evidence of prolapse, good support.    Cervix: posterior, no bleeding  Uterus: AF,well involuted, mobile, non-tender  Adnexa: No adnexal masses, non-tender  Rectal: deferred Extremities: no edema, erythema, or tenderness Neurologic: Grossly intact Psychiatric: mood  appropriate, affect full  Assessment: 28 y.o. G1P1001 presenting for 6 week postpartum visit Normal involution/ well healed 3A perineal laceration Plan:  1) Contraception Education: was on letrozole to conceive. Using condoms/ LAM  2)  Pap not done - ASCCP guidelines and rational discussed.  Patient opts for every 3 year screening interval. Next due 2022  3) Patient underwent screening for postpartum depression with no concerns noted.  4) Discussed return to normal activity, recommend continuing prenatal vitamins. Note given to return to work when ready  5) Follow up 1 year for routine annual exam.  Dalia Heading, CNM

## 2019-05-15 ENCOUNTER — Ambulatory Visit (INDEPENDENT_AMBULATORY_CARE_PROVIDER_SITE_OTHER): Payer: BC Managed Care – PPO | Admitting: Certified Nurse Midwife

## 2019-05-15 ENCOUNTER — Other Ambulatory Visit: Payer: Self-pay

## 2019-05-15 ENCOUNTER — Encounter: Payer: Self-pay | Admitting: Certified Nurse Midwife

## 2019-05-15 DIAGNOSIS — Z1389 Encounter for screening for other disorder: Secondary | ICD-10-CM

## 2019-12-31 ENCOUNTER — Other Ambulatory Visit: Payer: Self-pay | Admitting: Obstetrics and Gynecology

## 2019-12-31 DIAGNOSIS — N970 Female infertility associated with anovulation: Secondary | ICD-10-CM

## 2019-12-31 MED ORDER — LETROZOLE 2.5 MG PO TABS: 7.5000 mg | ORAL_TABLET | Freq: Every day | ORAL | 0 refills | Status: AC

## 2019-12-31 MED ORDER — MEDROXYPROGESTERONE ACETATE 10 MG PO TABS: 10.0000 mg | ORAL_TABLET | Freq: Every day | ORAL | 0 refills | Status: DC

## 2020-02-01 ENCOUNTER — Other Ambulatory Visit: Payer: Self-pay | Admitting: Obstetrics and Gynecology

## 2020-02-01 ENCOUNTER — Telehealth: Payer: Self-pay | Admitting: Obstetrics and Gynecology

## 2020-02-01 DIAGNOSIS — N911 Secondary amenorrhea: Secondary | ICD-10-CM

## 2020-02-01 NOTE — Telephone Encounter (Signed)
Discussed amenorrhea and no response to progesterone.   Will get TSH/FT4, PRL, and FSH. If all normal, will cycle with premarin 1.25 mg po x 14 days followed by medroxyprogesterone acetate 10 mg x 10 days.

## 2020-02-02 ENCOUNTER — Other Ambulatory Visit: Payer: Self-pay

## 2020-02-02 ENCOUNTER — Telehealth: Payer: Self-pay | Admitting: Obstetrics and Gynecology

## 2020-02-02 DIAGNOSIS — N911 Secondary amenorrhea: Secondary | ICD-10-CM

## 2020-02-02 NOTE — Telephone Encounter (Signed)
Patient is calling to have labs released in our system so she can get these taken care of at an outpatient facility

## 2020-02-03 NOTE — Telephone Encounter (Signed)
The orders are in, she should be able to go any [facility]

## 2020-02-04 LAB — FOLLICLE STIMULATING HORMONE: FSH: 6.1 m[IU]/mL

## 2020-02-04 LAB — TSH+FREE T4
Free T4: 1.24 ng/dL (ref 0.82–1.77)
TSH: 2.83 u[IU]/mL (ref 0.450–4.500)

## 2020-02-04 LAB — PROLACTIN: Prolactin: 4.2 ng/mL — ABNORMAL LOW (ref 4.8–23.3)

## 2020-02-09 ENCOUNTER — Other Ambulatory Visit: Payer: Self-pay | Admitting: Obstetrics and Gynecology

## 2020-02-09 DIAGNOSIS — N911 Secondary amenorrhea: Secondary | ICD-10-CM

## 2020-02-09 MED ORDER — ESTROGENS CONJUGATED 1.25 MG PO TABS: 1.2500 mg | ORAL_TABLET | Freq: Every day | ORAL | 0 refills | Status: DC

## 2020-02-09 MED ORDER — MEDROXYPROGESTERONE ACETATE 10 MG PO TABS: 10.0000 mg | ORAL_TABLET | Freq: Every day | ORAL | 0 refills | Status: DC

## 2020-02-10 ENCOUNTER — Other Ambulatory Visit: Payer: Self-pay | Admitting: Obstetrics and Gynecology

## 2020-02-10 DIAGNOSIS — N911 Secondary amenorrhea: Secondary | ICD-10-CM

## 2020-02-10 MED ORDER — ESTRADIOL 2 MG PO TABS: 2.0000 mg | ORAL_TABLET | Freq: Every day | ORAL | 0 refills | Status: DC

## 2020-02-22 ENCOUNTER — Other Ambulatory Visit: Payer: Self-pay | Admitting: Obstetrics and Gynecology

## 2020-02-22 DIAGNOSIS — N911 Secondary amenorrhea: Secondary | ICD-10-CM

## 2020-02-22 MED ORDER — MEDROXYPROGESTERONE ACETATE 10 MG PO TABS: 10.0000 mg | ORAL_TABLET | Freq: Every day | ORAL | 0 refills | Status: DC

## 2020-03-18 ENCOUNTER — Other Ambulatory Visit: Payer: Self-pay

## 2020-03-18 ENCOUNTER — Encounter: Payer: Self-pay | Admitting: Emergency Medicine

## 2020-03-18 ENCOUNTER — Ambulatory Visit
Admission: EM | Admit: 2020-03-18 | Discharge: 2020-03-18 | Disposition: A | Discharge status: Home / Self Care | Payer: BC Managed Care – PPO | Attending: Family | Admitting: Family

## 2020-03-18 DIAGNOSIS — J320 Chronic maxillary sinusitis: Secondary | ICD-10-CM

## 2020-03-18 DIAGNOSIS — L03031 Cellulitis of right toe: Secondary | ICD-10-CM | POA: Diagnosis not present

## 2020-03-18 DIAGNOSIS — R519 Headache, unspecified: Secondary | ICD-10-CM | POA: Diagnosis not present

## 2020-03-18 MED ORDER — AMOXICILLIN-POT CLAVULANATE 875-125 MG PO TABS: 1.0000 | ORAL_TABLET | Freq: Two times a day (BID) | ORAL | 0 refills | Status: AC

## 2020-03-18 NOTE — ED Triage Notes (Signed)
Patient c/o sinus pain and pressure, left sided tooth pain and facial swelling that started a week ago.  Patient denies fevers.

## 2020-03-18 NOTE — ED Provider Notes (Signed)
[facility]    CSN: [id number] Arrival date & time: 03/18/20  1000      History [id number]  Chief Complaint Chief Complaint  Patient presents with  . Sinus Problem    HPI Helen Black is a 29 y.o. female.   29 year old female presents with left sided facial pain and sinus pressure for over 1 week. Also having slight left sided facial swelling and sinus headaches. Denies any fever, cough, or GI symptoms. Experiencing thick discolored nasal mucus and slight sore throat. Left upper teeth hurt. Has tried OTC sinus medication with Tylenol with minimal relief. Also requests evaluation of her right big toe. Nail has been discolored and not growing for the past 6 months. Within the past few days the skin at the base of the nail has become more red, swollen and tender. No drainage. No other chronic health issues. On Prenatal vitamins daily and takes Estrace and Provera if needed.   The history is provided by the patient.    Past Medical History:  Diagnosis Date  . Gastroparesis   . History of fasciotomy     Patient Active Problem List   Diagnosis Date Noted  . Vaginal delivery 04/02/2019  . Third degree laceration of perineum, type 3a 04/02/2019  . Postpartum care following vaginal delivery 04/02/2019  . Supervision of normal first pregnancy 07/28/2018  . Female infertility associated with anovulation 02/27/2018  . Amenorrhea 02/27/2018  . Delayed gastric emptying 01/06/2016  . Family history of thyroid disease in sister 01/06/2016  . Early satiety 11/02/2014    Past Surgical History:  Procedure Laterality Date  . ANTERIOR COMPARTMENT DECOMPRESSION      OB History    Gravida  1   Para  1   Term  1   Preterm  0   AB  0   Living  1     SAB  0   TAB  0   Ectopic  0   Multiple  0   Live Births  1            Home Medications    Prior to Admission medications   Medication Sig Start Date End Date Taking? Authorizing Provider  PRENATAL 28-0.8 MG  TABS Take by mouth.   Yes [provider]  amoxicillin-clavulanate (AUGMENTIN) 875-125 MG tablet Take 1 tablet by mouth every 12 (twelve) hours for 7 days. 03/18/20 03/25/20  Sudie GrumblingAmyot, Jouri Threat Berry, NP  estradiol (ESTRACE) 2 MG tablet Take 1 tablet (2 mg total) by mouth daily for 14 days. 02/10/20 02/24/20  Conard NovakJackson, Stephen D, MD  medroxyPROGESTERone (PROVERA) 10 MG tablet Take 1 tablet (10 mg total) by mouth daily for 10 days. 02/22/20 03/03/20  Conard NovakJackson, Stephen D, MD    Family History Family History  Problem Relation Age of Onset  . Hypothyroidism Sister     Social History Social History   Tobacco Use  . Smoking status: Never Smoker  . Smokeless tobacco: Never Used  Vaping Use  . Vaping Use: Never used  Substance Use Topics  . Alcohol use: Yes    Comment: occasionally  . Drug use: No     Allergies   Patient has no known allergies.   Review of Systems Review of Systems  Constitutional: Positive for fatigue. Negative for appetite change, chills and fever.  HENT: Positive for congestion, facial swelling, postnasal drip, sinus pressure, sinus pain and sore throat. Negative for dental problem, ear discharge, ear pain, mouth sores, nosebleeds, sneezing and  trouble swallowing.   Eyes: Negative for pain, discharge, redness and itching.  Respiratory: Negative for cough, chest tightness, shortness of breath and wheezing.   Gastrointestinal: Negative for diarrhea, nausea and vomiting.  Musculoskeletal: Negative for arthralgias, myalgias, neck pain and neck stiffness.  Skin: Positive for color change. Negative for rash and wound.  Allergic/Immunologic: Negative for environmental allergies, food allergies and immunocompromised state.  Neurological: Positive for headaches. Negative for dizziness, seizures, syncope, facial asymmetry, weakness, light-headedness and numbness.  Hematological: Negative for adenopathy. Does not bruise/bleed easily.     Physical Exam Triage Vital Signs ED  Triage Vitals  Enc Vitals Group     BP 03/18/20 1017 103/77     Pulse Rate 03/18/20 1017 92     Resp 03/18/20 1017 14     Temp 03/18/20 1017 98.4 F (36.9 C)     Temp Source 03/18/20 1017 Oral     SpO2 03/18/20 1017 99 %     Weight 03/18/20 1014 105 lb (47.6 kg)     Height 03/18/20 1014 5\' 7"  (1.702 m)     Head Circumference --      Peak Flow --      Pain Score 03/18/20 1013 6     Pain Loc --      Pain Edu? --      Excl. in GC? --    No data found.  Updated Vital Signs BP 103/77 (BP Location: Right Arm)   Pulse 92   Temp 98.4 F (36.9 C) (Oral)   Resp 14   Ht 5\' 7"  (1.702 m)   Wt 105 lb (47.6 kg)   LMP 03/11/2020 (Approximate)   SpO2 99%   Breastfeeding No   BMI 16.45 kg/m   Visual Acuity Right Eye Distance:   Left Eye Distance:   Bilateral Distance:    Right Eye Near:   Left Eye Near:    Bilateral Near:     Physical Exam Vitals and nursing note reviewed.  Constitutional:      General: She is awake. She is not in acute distress.    Appearance: She is well-developed, well-groomed and normal weight. She is ill-appearing.     Comments: She is sitting comfortably on the exam table in no acute distress but appears uncomfortable due to facial pain.   HENT:     Head: Normocephalic and atraumatic.     Right Ear: Hearing, tympanic membrane, ear canal and external ear normal.     Left Ear: Hearing, tympanic membrane, ear canal and external ear normal.     Nose: Mucosal edema and congestion present.     Right Sinus: No maxillary sinus tenderness or frontal sinus tenderness.     Left Sinus: Maxillary sinus tenderness present. No frontal sinus tenderness.     Mouth/Throat:     Lips: Pink.     Mouth: Mucous membranes are moist.     Pharynx: Oropharynx is clear. Uvula midline. No pharyngeal swelling, oropharyngeal exudate, posterior oropharyngeal erythema or uvula swelling.  Eyes:     Extraocular Movements: Extraocular movements intact.     Conjunctiva/sclera:  Conjunctivae normal.  Cardiovascular:     Rate and Rhythm: Normal rate and regular rhythm.     Pulses:          Dorsalis pedis pulses are 2+ on the right side and 2+ on the left side.     Heart sounds: Normal heart sounds. No murmur heard.   Pulmonary:     Effort: Pulmonary effort is  normal. No respiratory distress.     Breath sounds: Normal breath sounds and air entry. No decreased air movement. No decreased breath sounds, wheezing or rhonchi.  Musculoskeletal:        General: Swelling and tenderness present. Normal range of motion.     Cervical back: Normal range of motion and neck supple. No rigidity or tenderness.     Right foot: Normal range of motion.     Left foot: Normal range of motion.       Feet:  Feet:     Left foot:     Skin integrity: Erythema and warmth present. No blister or skin breakdown.     Toenail Condition: Left toenails are abnormally thick and ingrown.     Comments: Nail of right great toe, yellow and brown discoloration of entire nail. Slightly more raised at the base. Swelling, redness and tender of skin at base of toenail and on lateral edge. No distinct discharge or drainage.  Lymphadenopathy:     Cervical: No cervical adenopathy.  Skin:    General: Skin is warm and dry.     Capillary Refill: Capillary refill takes less than 2 seconds.     Findings: Erythema present. No bruising or ecchymosis.  Neurological:     General: No focal deficit present.     Mental Status: She is alert and oriented to person, place, and time.  Psychiatric:        Mood and Affect: Mood normal.        Behavior: Behavior normal. Behavior is cooperative.        Thought Content: Thought content normal.        Judgment: Judgment normal.      UC Treatments / Results  Labs (all labs ordered are listed, but only abnormal results are displayed) Labs Reviewed - No data to display  EKG   Radiology No results found.  Procedures Procedures (including critical care  time)  Medications Ordered in UC Medications - No data to display  Initial Impression / Assessment and Plan / UC Course  I have reviewed the triage vital signs and the nursing notes.  Pertinent labs & imaging results that were available during my care of the patient were reviewed by me and considered in my medical decision making (see chart for details).    Reviewed with patient that she has a left sided maxillary sinus infection- will treat for possible bacterial infection. Also discussed that she has paronychia of the great right toe with possibility of fungal infection or damage to toenail and base. Will start on Augmentin 875mg  twice a day as directed. Use OTC Sudafed 60mg  every 8 hours as needed for congestion. May take Ibuprofen 600mg  every 6 to 8 hours as needed for sinus pain and headache. Increase fluid intake to help loosen up mucus. Continue to monitor right big toe and nail- may need to see Podiatrist. Follow-up with her PCP for routine visit next week as planned for recheck.  Final Clinical Impressions(s) / UC Diagnoses   Final diagnoses:  Left maxillary sinusitis  Sinus headache  Paronychia of great toe of right foot     Discharge Instructions     Recommend start Augmentin (antibiotic) 875mg  twice a day as directed. May use OTC Sudafed 60mg  every 8 hours as needed for congestion. May take Ibuprofen 600mg  every 6 to 8 hours as needed for sinus pain and headache. Increase fluid intake to help loosen up mucus in sinuses. Continue to monitor right big toe  and nail. Follow-up with your PCP as planned next week.     ED Prescriptions    Medication Sig Dispense Auth. Provider   amoxicillin-clavulanate (AUGMENTIN) 875-125 MG tablet Take 1 tablet by mouth every 12 (twelve) hours for 7 days. 14 tablet Neng Albee, Ali Lowe, NP     PDMP not reviewed this encounter.   Sudie Grumbling, NP 03/19/20 1711

## 2020-03-18 NOTE — Discharge Instructions (Signed)
Recommend start Augmentin (antibiotic) 875mg  twice a day as directed. May use OTC Sudafed 60mg  every 8 hours as needed for congestion. May take Ibuprofen 600mg  every 6 to 8 hours as needed for sinus pain and headache. Increase fluid intake to help loosen up mucus in sinuses. Continue to monitor right big toe and nail. Follow-up with your PCP as planned next week.

## 2020-03-30 ENCOUNTER — Other Ambulatory Visit: Payer: Self-pay | Admitting: Obstetrics and Gynecology

## 2020-03-30 DIAGNOSIS — N970 Female infertility associated with anovulation: Secondary | ICD-10-CM

## 2020-03-30 MED ORDER — LETROZOLE 2.5 MG PO TABS: 7.5000 mg | ORAL_TABLET | Freq: Every day | ORAL | 0 refills | Status: AC

## 2020-04-08 ENCOUNTER — Other Ambulatory Visit: Payer: Self-pay | Admitting: Obstetrics and Gynecology

## 2020-04-08 DIAGNOSIS — N970 Female infertility associated with anovulation: Secondary | ICD-10-CM

## 2020-04-08 MED ORDER — MEDROXYPROGESTERONE ACETATE 10 MG PO TABS: 10.0000 mg | ORAL_TABLET | Freq: Every day | ORAL | 0 refills | Status: DC

## 2020-04-24 ENCOUNTER — Other Ambulatory Visit: Payer: Self-pay | Admitting: Obstetrics and Gynecology

## 2020-04-24 DIAGNOSIS — N970 Female infertility associated with anovulation: Secondary | ICD-10-CM

## 2020-04-24 MED ORDER — LETROZOLE 2.5 MG PO TABS: 7.5000 mg | ORAL_TABLET | Freq: Every day | ORAL | 0 refills | Status: DC

## 2020-05-26 ENCOUNTER — Other Ambulatory Visit: Payer: Self-pay | Admitting: Obstetrics and Gynecology

## 2020-05-26 DIAGNOSIS — N970 Female infertility associated with anovulation: Secondary | ICD-10-CM

## 2020-05-26 MED ORDER — MEDROXYPROGESTERONE ACETATE 10 MG PO TABS: 10.0000 mg | ORAL_TABLET | Freq: Every day | ORAL | 0 refills | Status: DC

## 2020-06-17 ENCOUNTER — Other Ambulatory Visit: Payer: Self-pay | Admitting: Obstetrics and Gynecology

## 2020-06-17 DIAGNOSIS — N911 Secondary amenorrhea: Secondary | ICD-10-CM

## 2020-06-17 DIAGNOSIS — N970 Female infertility associated with anovulation: Secondary | ICD-10-CM

## 2020-06-17 MED ORDER — MEDROXYPROGESTERONE ACETATE 10 MG PO TABS: 10.0000 mg | ORAL_TABLET | Freq: Every day | ORAL | 0 refills | Status: DC

## 2020-06-17 MED ORDER — ESTRADIOL 2 MG PO TABS: 2.0000 mg | ORAL_TABLET | Freq: Every day | ORAL | 0 refills | Status: DC

## 2020-06-20 ENCOUNTER — Other Ambulatory Visit: Payer: Self-pay | Admitting: Obstetrics and Gynecology

## 2020-06-20 DIAGNOSIS — N911 Secondary amenorrhea: Secondary | ICD-10-CM

## 2020-06-20 MED ORDER — ESTRADIOL 2 MG PO TABS: 2.0000 mg | ORAL_TABLET | Freq: Every day | ORAL | 0 refills | Status: DC

## 2020-07-03 MED ORDER — MEDROXYPROGESTERONE ACETATE 10 MG PO TABS: 10.0000 mg | ORAL_TABLET | Freq: Every day | ORAL | 0 refills | Status: DC

## 2020-07-09 ENCOUNTER — Other Ambulatory Visit: Payer: Self-pay | Admitting: Obstetrics and Gynecology

## 2020-07-09 DIAGNOSIS — N970 Female infertility associated with anovulation: Secondary | ICD-10-CM

## 2020-07-12 ENCOUNTER — Other Ambulatory Visit: Payer: Self-pay | Admitting: Obstetrics and Gynecology

## 2020-07-12 DIAGNOSIS — N970 Female infertility associated with anovulation: Secondary | ICD-10-CM

## 2020-07-12 MED ORDER — LETROZOLE 2.5 MG PO TABS: 7.5000 mg | ORAL_TABLET | Freq: Every day | ORAL | 0 refills | Status: DC

## 2020-07-16 ENCOUNTER — Other Ambulatory Visit: Payer: Self-pay | Admitting: Obstetrics and Gynecology

## 2020-07-16 DIAGNOSIS — N970 Female infertility associated with anovulation: Secondary | ICD-10-CM

## 2020-07-25 ENCOUNTER — Other Ambulatory Visit: Payer: BC Managed Care – PPO

## 2020-07-25 ENCOUNTER — Other Ambulatory Visit: Payer: Self-pay

## 2020-08-02 ENCOUNTER — Other Ambulatory Visit: Payer: Self-pay | Admitting: Obstetrics and Gynecology

## 2020-08-03 LAB — PROGESTERONE: Progesterone: 0.1 ng/mL

## 2020-08-04 DIAGNOSIS — R636 Underweight: Secondary | ICD-10-CM | POA: Insufficient documentation

## 2020-08-21 ENCOUNTER — Other Ambulatory Visit: Payer: Self-pay | Admitting: Obstetrics and Gynecology

## 2020-08-21 DIAGNOSIS — N970 Female infertility associated with anovulation: Secondary | ICD-10-CM

## 2020-09-01 ENCOUNTER — Other Ambulatory Visit: Payer: Self-pay | Admitting: Obstetrics & Gynecology

## 2020-09-01 ENCOUNTER — Other Ambulatory Visit: Payer: Self-pay

## 2020-09-01 ENCOUNTER — Other Ambulatory Visit: Payer: BC Managed Care – PPO

## 2020-09-01 ENCOUNTER — Other Ambulatory Visit (INDEPENDENT_AMBULATORY_CARE_PROVIDER_SITE_OTHER): Payer: BC Managed Care – PPO

## 2020-09-01 DIAGNOSIS — N970 Female infertility associated with anovulation: Secondary | ICD-10-CM

## 2020-09-02 LAB — TSH: TSH: 3.18 u[IU]/mL (ref 0.450–4.500)

## 2020-09-02 LAB — FOLLICLE STIMULATING HORMONE: FSH: 4.4 m[IU]/mL

## 2020-09-02 LAB — ESTRADIOL: Estradiol: 21.3 pg/mL

## 2020-09-02 LAB — PROLACTIN: Prolactin: 5.7 ng/mL (ref 4.8–23.3)

## 2020-09-08 ENCOUNTER — Ambulatory Visit: Payer: BC Managed Care – PPO | Admitting: Obstetrics and Gynecology

## 2020-09-15 ENCOUNTER — Encounter: Payer: Self-pay | Admitting: Obstetrics and Gynecology

## 2020-09-15 ENCOUNTER — Other Ambulatory Visit: Payer: Self-pay

## 2020-09-15 ENCOUNTER — Other Ambulatory Visit (HOSPITAL_COMMUNITY)
Admission: RE | Admit: 2020-09-15 | Discharge: 2020-09-15 | Disposition: A | Discharge status: Home / Self Care | Payer: BC Managed Care – PPO | Source: Ambulatory Visit | Attending: Obstetrics and Gynecology | Admitting: Obstetrics and Gynecology

## 2020-09-15 ENCOUNTER — Ambulatory Visit (INDEPENDENT_AMBULATORY_CARE_PROVIDER_SITE_OTHER): Payer: BC Managed Care – PPO | Admitting: Obstetrics and Gynecology

## 2020-09-15 VITALS — BP 105/71 | Ht 67.0 in | Wt 110.0 lb

## 2020-09-15 DIAGNOSIS — Z1331 Encounter for screening for depression: Secondary | ICD-10-CM

## 2020-09-15 DIAGNOSIS — Z124 Encounter for screening for malignant neoplasm of cervix: Secondary | ICD-10-CM

## 2020-09-15 DIAGNOSIS — Z1339 Encounter for screening examination for other mental health and behavioral disorders: Secondary | ICD-10-CM

## 2020-09-15 DIAGNOSIS — Z01419 Encounter for gynecological examination (general) (routine) without abnormal findings: Secondary | ICD-10-CM | POA: Diagnosis not present

## 2020-09-15 DIAGNOSIS — N970 Female infertility associated with anovulation: Secondary | ICD-10-CM

## 2020-09-15 MED ORDER — CLOMIPHENE CITRATE 50 MG PO TABS: 50.0000 mg | ORAL_TABLET | Freq: Every day | ORAL | 0 refills | Status: DC
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
## 2020-09-16 ENCOUNTER — Encounter: Payer: Self-pay | Admitting: Obstetrics and Gynecology

## 2020-09-19 LAB — CYTOLOGY - PAP
Comment: NEGATIVE
Diagnosis: NEGATIVE
High risk HPV: NEGATIVE

## 2020-10-04 ENCOUNTER — Other Ambulatory Visit: Payer: Self-pay | Admitting: Obstetrics and Gynecology

## 2020-10-04 DIAGNOSIS — N970 Female infertility associated with anovulation: Secondary | ICD-10-CM

## 2020-10-11 ENCOUNTER — Telehealth: Payer: Self-pay | Admitting: Internal Medicine

## 2020-10-11 NOTE — Telephone Encounter (Signed)
PT is requesting progesterone lab order to be released to the [facility] in [place] Please advise.

## 2020-10-12 ENCOUNTER — Other Ambulatory Visit: Payer: Self-pay

## 2020-10-12 DIAGNOSIS — N970 Female infertility associated with anovulation: Secondary | ICD-10-CM

## 2020-10-15 LAB — PROGESTERONE: Progesterone: 29.2 ng/mL

## 2020-10-20 ENCOUNTER — Other Ambulatory Visit: Payer: Self-pay | Admitting: Obstetrics and Gynecology

## 2020-10-20 DIAGNOSIS — N970 Female infertility associated with anovulation: Secondary | ICD-10-CM

## 2020-10-20 MED ORDER — CLOMIPHENE CITRATE 50 MG PO TABS: 50.0000 mg | ORAL_TABLET | Freq: Every day | ORAL | 0 refills | Status: DC

## 2020-10-22 ENCOUNTER — Other Ambulatory Visit: Payer: Self-pay | Admitting: Obstetrics and Gynecology

## 2020-10-22 DIAGNOSIS — N970 Female infertility associated with anovulation: Secondary | ICD-10-CM

## 2020-11-08 ENCOUNTER — Other Ambulatory Visit: Payer: Self-pay

## 2020-11-08 DIAGNOSIS — N970 Female infertility associated with anovulation: Secondary | ICD-10-CM

## 2020-11-10 LAB — PROGESTERONE: Progesterone: 0.2 ng/mL

## 2020-11-17 ENCOUNTER — Other Ambulatory Visit: Payer: Self-pay | Admitting: Obstetrics and Gynecology

## 2020-11-17 DIAGNOSIS — N970 Female infertility associated with anovulation: Secondary | ICD-10-CM

## 2020-11-21 ENCOUNTER — Other Ambulatory Visit: Payer: Self-pay

## 2020-11-21 DIAGNOSIS — N970 Female infertility associated with anovulation: Secondary | ICD-10-CM

## 2020-11-22 LAB — PROGESTERONE: Progesterone: 15.7 ng/mL

## 2020-11-30 ENCOUNTER — Other Ambulatory Visit: Payer: Self-pay | Admitting: Obstetrics and Gynecology

## 2020-11-30 ENCOUNTER — Other Ambulatory Visit: Payer: Self-pay

## 2020-11-30 DIAGNOSIS — N926 Irregular menstruation, unspecified: Secondary | ICD-10-CM

## 2020-12-01 ENCOUNTER — Other Ambulatory Visit: Payer: Self-pay

## 2020-12-01 DIAGNOSIS — N926 Irregular menstruation, unspecified: Secondary | ICD-10-CM

## 2020-12-01 LAB — BETA HCG QUANT (REF LAB): hCG Quant: 5 m[IU]/mL

## 2020-12-01 NOTE — Addendum Note (Signed)
Addended by: Thomasene Mohair D on: 12/01/2020 10:17 AM   Modules accepted: Orders

## 2020-12-02 ENCOUNTER — Other Ambulatory Visit: Payer: Self-pay | Admitting: Obstetrics and Gynecology

## 2020-12-02 DIAGNOSIS — N970 Female infertility associated with anovulation: Secondary | ICD-10-CM

## 2020-12-02 LAB — BETA HCG QUANT (REF LAB): hCG Quant: 5 m[IU]/mL

## 2020-12-02 MED ORDER — CLOMIPHENE CITRATE 50 MG PO TABS: 100.0000 mg | ORAL_TABLET | Freq: Every day | ORAL | 0 refills | Status: DC

## 2020-12-19 ENCOUNTER — Other Ambulatory Visit: Payer: Self-pay | Admitting: Obstetrics and Gynecology

## 2020-12-19 DIAGNOSIS — N970 Female infertility associated with anovulation: Secondary | ICD-10-CM

## 2020-12-22 ENCOUNTER — Encounter: Payer: BC Managed Care – PPO | Admitting: Obstetrics and Gynecology

## 2020-12-23 ENCOUNTER — Other Ambulatory Visit: Payer: Self-pay

## 2020-12-23 DIAGNOSIS — N970 Female infertility associated with anovulation: Secondary | ICD-10-CM

## 2020-12-27 LAB — PROGESTERONE: Progesterone: 0.2 ng/mL

## 2020-12-28 ENCOUNTER — Other Ambulatory Visit: Payer: Self-pay | Admitting: Obstetrics and Gynecology

## 2020-12-28 DIAGNOSIS — N970 Female infertility associated with anovulation: Secondary | ICD-10-CM

## 2021-01-04 ENCOUNTER — Other Ambulatory Visit: Payer: Self-pay | Admitting: Obstetrics and Gynecology

## 2021-01-05 LAB — PROGESTERONE: Progesterone: 19.5 ng/mL

## 2021-01-13 ENCOUNTER — Other Ambulatory Visit: Payer: Self-pay | Admitting: Obstetrics and Gynecology

## 2021-01-13 DIAGNOSIS — N970 Female infertility associated with anovulation: Secondary | ICD-10-CM

## 2021-01-13 MED ORDER — CLOMIPHENE CITRATE 50 MG PO TABS: 150.0000 mg | ORAL_TABLET | Freq: Every day | ORAL | 0 refills | Status: AC

## 2021-01-30 ENCOUNTER — Other Ambulatory Visit: Payer: Self-pay | Admitting: Obstetrics and Gynecology

## 2021-01-30 DIAGNOSIS — N970 Female infertility associated with anovulation: Secondary | ICD-10-CM

## 2021-02-07 ENCOUNTER — Other Ambulatory Visit: Payer: Self-pay | Admitting: Obstetrics and Gynecology

## 2021-02-07 DIAGNOSIS — N970 Female infertility associated with anovulation: Secondary | ICD-10-CM

## 2021-02-10 ENCOUNTER — Other Ambulatory Visit: Payer: Self-pay

## 2021-02-11 LAB — PROGESTERONE: Progesterone: 0.2 ng/mL

## 2021-02-20 ENCOUNTER — Other Ambulatory Visit: Payer: Self-pay | Admitting: Obstetrics and Gynecology

## 2021-02-20 DIAGNOSIS — N970 Female infertility associated with anovulation: Secondary | ICD-10-CM

## 2021-02-20 MED ORDER — MEDROXYPROGESTERONE ACETATE 10 MG PO TABS: 10.0000 mg | ORAL_TABLET | Freq: Every day | ORAL | 0 refills | Status: DC

## 2021-03-10 ENCOUNTER — Telehealth: Payer: Self-pay

## 2021-03-10 ENCOUNTER — Other Ambulatory Visit: Payer: Self-pay | Admitting: Obstetrics and Gynecology

## 2021-03-10 DIAGNOSIS — N970 Female infertility associated with anovulation: Secondary | ICD-10-CM

## 2021-03-10 MED ORDER — LETROZOLE 2.5 MG PO TABS: 7.5000 mg | ORAL_TABLET | Freq: Every day | ORAL | 0 refills | Status: DC

## 2021-03-10 NOTE — Telephone Encounter (Signed)
Pt calling; today is day 3 of her cycle; needs rx sent in so she can start it on the correct day.  [phone]

## 2021-03-31 ENCOUNTER — Other Ambulatory Visit: Payer: Self-pay | Admitting: Obstetrics and Gynecology

## 2021-03-31 DIAGNOSIS — N970 Female infertility associated with anovulation: Secondary | ICD-10-CM

## 2021-04-03 ENCOUNTER — Other Ambulatory Visit: Payer: Self-pay

## 2021-04-03 DIAGNOSIS — N970 Female infertility associated with anovulation: Secondary | ICD-10-CM

## 2021-04-04 LAB — PROGESTERONE: Progesterone: 28.4 ng/mL

## 2021-04-10 ENCOUNTER — Other Ambulatory Visit: Payer: Self-pay | Admitting: Obstetrics and Gynecology

## 2021-04-10 ENCOUNTER — Other Ambulatory Visit: Payer: Self-pay

## 2021-04-10 DIAGNOSIS — Z3490 Encounter for supervision of normal pregnancy, unspecified, unspecified trimester: Secondary | ICD-10-CM

## 2021-04-10 NOTE — Telephone Encounter (Signed)
I placed orders for the patient if she wants to do this testing but it is not necessary. Would mostly recommend her scheduling a new OB for 2-3 weeks from now.  Thanks,  Dr. Jerene Pitch

## 2021-04-11 LAB — BETA HCG QUANT (REF LAB): hCG Quant: 339 m[IU]/mL

## 2021-04-13 ENCOUNTER — Other Ambulatory Visit: Payer: BC Managed Care – PPO

## 2021-04-13 ENCOUNTER — Telehealth: Payer: Self-pay

## 2021-04-13 ENCOUNTER — Other Ambulatory Visit: Payer: Self-pay | Admitting: Obstetrics and Gynecology

## 2021-04-13 ENCOUNTER — Other Ambulatory Visit: Payer: Self-pay

## 2021-04-13 DIAGNOSIS — Z3490 Encounter for supervision of normal pregnancy, unspecified, unspecified trimester: Secondary | ICD-10-CM

## 2021-04-13 LAB — PROGESTERONE: Progesterone: 31.8 ng/mL

## 2021-04-13 NOTE — Telephone Encounter (Signed)
There is a beta hcg in her orders but it is marked "needs to be released"

## 2021-04-13 NOTE — Telephone Encounter (Signed)
I have placed a new future order - you will likely need to release it again

## 2021-04-13 NOTE — Telephone Encounter (Signed)
Pt wants another HCG lab ordered so she can see if her numbers are going up. Please advise

## 2021-04-14 LAB — BETA HCG QUANT (REF LAB): hCG Quant: 1296 m[IU]/mL

## 2021-04-16 ENCOUNTER — Ambulatory Visit
Admission: EM | Admit: 2021-04-16 | Discharge: 2021-04-16 | Disposition: A | Discharge status: Home / Self Care | Payer: BC Managed Care – PPO | Attending: Emergency Medicine | Admitting: Emergency Medicine

## 2021-04-16 ENCOUNTER — Other Ambulatory Visit: Payer: Self-pay

## 2021-04-16 ENCOUNTER — Encounter: Payer: Self-pay | Admitting: Emergency Medicine

## 2021-04-16 ENCOUNTER — Ambulatory Visit (INDEPENDENT_AMBULATORY_CARE_PROVIDER_SITE_OTHER): Payer: BC Managed Care – PPO

## 2021-04-16 DIAGNOSIS — M25522 Pain in left elbow: Secondary | ICD-10-CM

## 2021-04-16 DIAGNOSIS — S52125A Nondisplaced fracture of head of left radius, initial encounter for closed fracture: Secondary | ICD-10-CM | POA: Diagnosis not present

## 2021-04-16 DIAGNOSIS — Z3A01 Less than 8 weeks gestation of pregnancy: Secondary | ICD-10-CM | POA: Diagnosis not present

## 2021-04-16 DIAGNOSIS — O9A211 Injury, poisoning and certain other consequences of external causes complicating pregnancy, first trimester: Secondary | ICD-10-CM | POA: Diagnosis not present
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
## 2021-04-16 NOTE — Discharge Instructions (Addendum)
Rest and elevate your elbow.  Apply ice packs 2-3 times a day for up to 20 minutes each.  Wear the arm splint and sling.    Follow up with orthopedics tomorrow.
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
## 2021-04-17 ENCOUNTER — Other Ambulatory Visit: Payer: BC Managed Care – PPO

## 2021-04-17 ENCOUNTER — Other Ambulatory Visit: Payer: Self-pay | Admitting: Obstetrics & Gynecology

## 2021-04-17 ENCOUNTER — Telehealth: Payer: Self-pay

## 2021-04-17 DIAGNOSIS — N926 Irregular menstruation, unspecified: Secondary | ICD-10-CM

## 2021-04-17 NOTE — Telephone Encounter (Signed)
Patient is scheduled for labs 04/17/21

## 2021-04-17 NOTE — Telephone Encounter (Signed)
Pt came to office for blood work; adv via AW pt can take e.s. tylenol 2 every 4-6h while awake.
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
## 2021-04-18 LAB — BETA HCG QUANT (REF LAB): hCG Quant: 5147 m[IU]/mL

## 2021-05-11 ENCOUNTER — Encounter: Payer: Self-pay | Admitting: Obstetrics and Gynecology

## 2021-05-12 ENCOUNTER — Encounter: Payer: Self-pay | Admitting: Obstetrics

## 2021-05-12 ENCOUNTER — Other Ambulatory Visit (HOSPITAL_COMMUNITY)
Admission: RE | Admit: 2021-05-12 | Discharge: 2021-05-12 | Disposition: A | Discharge status: Home / Self Care | Payer: BC Managed Care – PPO | Source: Ambulatory Visit | Attending: Obstetrics and Gynecology | Admitting: Obstetrics and Gynecology

## 2021-05-12 ENCOUNTER — Ambulatory Visit (INDEPENDENT_AMBULATORY_CARE_PROVIDER_SITE_OTHER): Payer: BC Managed Care – PPO | Admitting: Obstetrics

## 2021-05-12 ENCOUNTER — Other Ambulatory Visit: Payer: Self-pay

## 2021-05-12 VITALS — BP 98/56 | HR 71 | Wt 113.0 lb

## 2021-05-12 DIAGNOSIS — Z124 Encounter for screening for malignant neoplasm of cervix: Secondary | ICD-10-CM

## 2021-05-12 DIAGNOSIS — Z113 Encounter for screening for infections with a predominantly sexual mode of transmission: Secondary | ICD-10-CM | POA: Insufficient documentation

## 2021-05-12 DIAGNOSIS — Z3480 Encounter for supervision of other normal pregnancy, unspecified trimester: Secondary | ICD-10-CM

## 2021-05-12 DIAGNOSIS — Z3A09 9 weeks gestation of pregnancy: Secondary | ICD-10-CM

## 2021-05-12 NOTE — Progress Notes (Signed)
New Obstetric Patient H&P    Chief Complaint: "Desires prenatal care"   History of Present Illness: Patient is a 30 y.o. G2P1001 Not Hispanic or Latino female, LMP 03/09/2021 presents with amenorrhea and positive home pregnancy test. Based on her  LMP, her EDD is Estimated Date of Delivery: 12/14/21 and her EGA is [redacted]w[redacted]d. Cycles are 4. days, have been regular on OCPs , but has history of infertility, and occur approximately every : 28 days. Her last pap smear was about 1 years ago and was no abnormalities.    She had a urine pregnancy test which was positive about 2 week(s)  ago. Her last menstrual period was normal and lasted for  about 4 day(s). Since her LMP she claims she has experienced some initial nausea, which is much less now ( she is using Vitamin B6 and Unisom ). She denies vaginal bleeding. Her past medical history is noncontributory. Her prior pregnancies are notable for a vaginal birth with a 3rd degree laceration  Since her LMP, she admits to the use of tobacco products  no She claims she has gained   3 pounds since the start of her pregnancy.  There are cats in the home in the home  no She admits close contact with children on a regular basis  yes  She has had chicken pox in the past yes She has had Tuberculosis exposures, symptoms, or previously tested positive for TB   no Current or past history of domestic violence. no  Genetic Screening/Teratology Counseling: (Includes patient, baby's father, or anyone in either family with:)   1. Patient's age >/= 70 [facility]  no 2. Thalassemia ([place], [place], Mediterranean, or Asian background): MCV<80  no 3. Neural tube defect (meningomyelocele, spina bifida, anencephaly)  no 4. Congenital heart defect  no  5. Down syndrome  no 6. Tay-Sachs (Jewish, [place]  no 7. Canavan's Disease  no 8. Sickle cell disease or trait (African)  no  9. Hemophilia or other blood disorders  no  10. Muscular dystrophy  no  11. Cystic  fibrosis  no  12. Huntington's Chorea  no  13. Mental retardation/autism  no 14. Other inherited genetic or chromosomal disorder  no 15. Maternal metabolic disorder (DM, PKU, etc)  no 16. Patient or FOB with a child with a birth defect not listed above no  16a. Patient or FOB with a birth defect themselves no 17. Recurrent pregnancy loss, or stillbirth  no  18. Any medications since LMP other than prenatal vitamins (include vitamins, supplements, OTC meds, drugs, alcohol)  no 19. Any other genetic/environmental exposure to discuss  no  Infection History:   1. Lives with someone with TB or TB exposed  no  2. Patient or partner has history of genital herpes  no 3. Rash or viral illness since LMP  no 4. History of STI (GC, CT, HPV, syphilis, HIV)  no 5. History of recent travel :  no  Other pertinent information:  no     Review of Systems:10 point review of systems negative unless otherwise noted in HPI  Past Medical History:  Past Medical History:  Diagnosis Date  . Gastroparesis   . History of fasciotomy     Past Surgical History:  Past Surgical History:  Procedure Laterality Date  . ANTERIOR COMPARTMENT DECOMPRESSION      Gynecologic History: Patient's last menstrual period was 03/09/2021.  Obstetric History: G2P1001  Family History:  Family History  Problem Relation  Age of Onset  . Hypothyroidism Sister     Social History:  Social History   Socioeconomic History  . Marital status: Married    Spouse name: Matt  . Number of children: Not on file  . Years of education: Not on file  . Highest education level: Not on file  Occupational History  . Not on file  Tobacco Use  . Smoking status: Never  . Smokeless tobacco: Never  Vaping Use  . Vaping Use: Never used  Substance and Sexual Activity  . Alcohol use: Yes    Comment: occasionally  . Drug use: No  . Sexual activity: Yes    Birth control/protection: None  Other Topics Concern  . Not on file   Social History Narrative  . Not on file   Social Determinants of Health   Financial Resource Strain: Not on file  Food Insecurity: Not on file  Transportation Needs: Not on file  Physical Activity: Not on file  Stress: Not on file  Social Connections: Not on file  Intimate Partner Violence: Not on file    Allergies:  No Known Allergies  Medications: Prior to Admission medications   Medication Sig Start Date End Date Taking? Authorizing Provider  PRENATAL 28-0.8 MG TABS Take by mouth.   Yes [provider]    Physical Exam Vitals: Blood pressure (!) 98/56, pulse 71, weight 113 lb (51.3 kg), last menstrual period 03/09/2021.  General: NAD HEENT: normocephalic, anicteric Thyroid: no enlargement, no palpable nodules Pulmonary: No increased work of breathing, CTAB Cardiovascular: RRR, distal pulses 2+ Abdomen: NABS, soft, non-tender, non-distended.  Umbilicus without lesions.  No hepatomegaly, splenomegaly or masses palpable. No evidence of hernia  Genitourinary:  External: Normal external female genitalia.  Normal urethral meatus, normal  Bartholin's and Skene's glands.    Vagina: Normal vaginal mucosa, no evidence of prolapse.    Cervix: Grossly normal in appearance, no bleeding  Uterus: anteverted, Non-enlarged, mobile, normal contour.  No CMT  Adnexa: ovaries non-enlarged, no adnexal masses  Rectal: deferred Extremities: no edema, erythema, or tenderness Neurologic: Grossly intact Psychiatric: mood appropriate, affect full   Assessment: 30 y.o. G2P1001 at [redacted]w[redacted]d presenting to initiate prenatal care  Plan: 1) Avoid alcoholic beverages. 2) Patient encouraged not to smoke.  3) Discontinue the use of all non-medicinal drugs and chemicals.  4) Take prenatal vitamins daily.  5) Nutrition, food safety (fish, cheese advisories, and high nitrite foods) and exercise discussed. 6) Hospital and practice style discussed with cross coverage system.  7) Genetic  Screening, such as with 1st Trimester Screening, cell free fetal DNA, AFP testing, and Ultrasound, as well as with amniocentesis and CVS as appropriate, is discussed with patient. At the conclusion of today's visit patient undecided genetic testing 8) Patient is asked about travel to areas at risk for the [place] virus, and counseled to avoid travel and exposure to mosquitoes or sexual partners who may have themselves been exposed to the virus. Testing is discussed, and will be ordered as appropriate.   RTDc in 2-3 weeks for dating scan and labwork. Pap and urine cx today  Discussed the MaternT testing, They have [employer].

## 2021-05-12 NOTE — Progress Notes (Signed)
NOB - no concerns. RM 5 °

## 2021-05-14 LAB — URINE CULTURE

## 2021-05-16 LAB — CYTOLOGY - PAP
Chlamydia: NEGATIVE
Comment: NEGATIVE
Comment: NEGATIVE
Comment: NORMAL
Diagnosis: NEGATIVE
High risk HPV: NEGATIVE
Neisseria Gonorrhea: NEGATIVE

## 2021-06-02 ENCOUNTER — Other Ambulatory Visit: Payer: Self-pay

## 2021-06-02 ENCOUNTER — Encounter: Payer: Self-pay | Admitting: Obstetrics & Gynecology

## 2021-06-02 ENCOUNTER — Ambulatory Visit (INDEPENDENT_AMBULATORY_CARE_PROVIDER_SITE_OTHER): Payer: BC Managed Care – PPO | Admitting: Obstetrics & Gynecology

## 2021-06-02 VITALS — BP 100/60 | Wt 113.0 lb

## 2021-06-02 DIAGNOSIS — N912 Amenorrhea, unspecified: Secondary | ICD-10-CM

## 2021-06-02 DIAGNOSIS — Z1379 Encounter for other screening for genetic and chromosomal anomalies: Secondary | ICD-10-CM

## 2021-06-02 DIAGNOSIS — Z3480 Encounter for supervision of other normal pregnancy, unspecified trimester: Secondary | ICD-10-CM

## 2021-06-02 DIAGNOSIS — Z3A12 12 weeks gestation of pregnancy: Secondary | ICD-10-CM | POA: Diagnosis not present

## 2021-06-02 DIAGNOSIS — Z3689 Encounter for other specified antenatal screening: Secondary | ICD-10-CM

## 2021-06-02 LAB — POCT URINALYSIS DIPSTICK OB
Glucose, UA: NEGATIVE
POC,PROTEIN,UA: NEGATIVE

## 2021-06-02 NOTE — Progress Notes (Signed)
ULTRASOUND REPORT  Location: [facility] Date of Service: 06/02/2021   Indications:dating Findings:  Mason Jim intrauterine pregnancy is visualized with a CRL consistent with [redacted]w[redacted]d gestation, giving an (U/S) EDD of 12/18/21. The (U/S) EDD is consistent with the clinically established EDD of 12/14/21.  FHR: 161 BPM CRL measurement: 48 mm Yolk sac is not visualized. Amnion: visualized and appears normal   Right Ovary is normal in appearance. Left Ovary is normal appearance. Corpus luteal cyst:  is not visualized Survey of the adnexa demonstrates no adnexal masses. There is no free peritoneal fluid in the cul de sac.  Impression: 1. [redacted]w[redacted]d Viable Singleton Intrauterine pregnancy by U/S. 2. (U/S) EDD is consistent with Clinically established EDD of 12/04/21.  Recommendations: 1.Clinical correlation with the patient's History and Physical Exam. 2. Keep EDC  Letitia Libra, MD

## 2021-06-02 NOTE — Progress Notes (Signed)
  Subjective  Nausea present, Unisom.B6 helps  Objective  BP 100/60   Wt 113 lb (51.3 kg)   LMP 03/09/2021 Comment: Patient states taht she is [redacted] weeks pregnant  BMI 17.70 kg/m  General: NAD Pumonary: no increased work of breathing Abdomen: gravid, non-tender Extremities: no edema Psychiatric: mood appropriate, affect full  Assessment  29 y.o. G2P1001 at [redacted]w[redacted]d by  12/14/2021, by Last Menstrual Period presenting for routine prenatal visit  Plan   Problem List Items Addressed This Visit      Other   Amenorrhea   Supervision of other normal pregnancy, antepartum - Primary   Relevant Orders   POC Urinalysis Dipstick OB (Completed)  Other Visit Diagnoses    [redacted] weeks gestation of pregnancy       Screening, antenatal, for fetal anatomic survey       Relevant Orders   US OB Comp + 14 Wk   Encounter for genetic screening for Down Syndrome       Relevant Orders   MaterniT21 PLUS Core+SCA    See [place] report today Labs soon w NIPT WOrks as Runner, broadcasting/film/video, discussed occupational risks and expectations    Annamarie Major, MD, [facility], [facility] 06/02/2021  4:43 PM

## 2021-06-02 NOTE — Patient Instructions (Signed)

## 2021-06-05 ENCOUNTER — Other Ambulatory Visit: Payer: BC Managed Care – PPO

## 2021-06-09 LAB — MATERNIT21 PLUS CORE+SCA
Fetal Fraction: 8
Monosomy X (Turner Syndrome): NOT DETECTED
Result (T21): NEGATIVE
Trisomy 13 (Patau syndrome): NEGATIVE
Trisomy 18 (Edwards syndrome): NEGATIVE
Trisomy 21 (Down syndrome): NEGATIVE
XXX (Triple X Syndrome): NOT DETECTED
XXY (Klinefelter Syndrome): NOT DETECTED
XYY (Jacobs Syndrome): NOT DETECTED

## 2021-06-30 ENCOUNTER — Other Ambulatory Visit: Payer: Self-pay

## 2021-06-30 ENCOUNTER — Ambulatory Visit (INDEPENDENT_AMBULATORY_CARE_PROVIDER_SITE_OTHER): Payer: BC Managed Care – PPO | Admitting: Obstetrics

## 2021-06-30 VITALS — BP 90/62 | Wt 119.0 lb

## 2021-06-30 DIAGNOSIS — Z3A16 16 weeks gestation of pregnancy: Secondary | ICD-10-CM

## 2021-06-30 DIAGNOSIS — Z3689 Encounter for other specified antenatal screening: Secondary | ICD-10-CM

## 2021-06-30 DIAGNOSIS — Z3480 Encounter for supervision of other normal pregnancy, unspecified trimester: Secondary | ICD-10-CM

## 2021-06-30 LAB — POCT URINALYSIS DIPSTICK OB
Glucose, UA: NEGATIVE
POC,PROTEIN,UA: NEGATIVE

## 2021-06-30 NOTE — Addendum Note (Signed)
Addended by: Mirna Mires on: 06/30/2021 04:02 PM   Modules accepted: Orders

## 2021-06-30 NOTE — Progress Notes (Signed)
  Routine Prenatal Care Visit  Subjective  Helen Black is a 30 y.o. G2P1001 at [redacted]w[redacted]d being seen today for ongoing prenatal care.  She is currently monitored for the following issues for this high-risk pregnancy and has Female infertility associated with anovulation; Amenorrhea; Delayed gastric emptying; Early satiety; Family history of thyroid disease in sister; Third degree laceration of perineum, type 3a; and Supervision of other normal pregnancy, antepartum on their problem list.  ----------------------------------------------------------------------------------- Patient reports no complaints.  She had a COVID booster and a flu shot yesterday.  Happy to be having a boy- name will likely be Hudson.  .  .   Pincus Large Fluid denies.  ----------------------------------------------------------------------------------- The following portions of the patient's history were reviewed and updated as appropriate: allergies, current medications, past family history, past medical history, past social history, past surgical history and problem list. Problem list updated.  Objective  Blood pressure 90/62, weight 119 lb (54 kg), last menstrual period 03/09/2021. Pregravid weight 110 lb (49.9 kg) Total Weight Gain 9 lb (4.082 kg) Urinalysis: Urine Protein Negative  Urine Glucose Negative  Fetal Status:           General:  Alert, oriented and cooperative. Patient is in no acute distress.  Skin: Skin is warm and dry. No rash noted.   Cardiovascular: Normal heart rate noted  Respiratory: Normal respiratory effort, no problems with respiration noted  Abdomen: Soft, gravid, appropriate for gestational age.       Pelvic:  Cervical exam deferred        Extremities: Normal range of motion.     Mental Status: Normal mood and affect. Normal behavior. Normal judgment and thought content.   Assessment   29 y.o. G2P1001 at [redacted]w[redacted]d by  12/14/2021, by Last Menstrual Period presenting for routine prenatal  visit  Plan   pregnancy 2 Problems (from 05/12/21 to present)    Problem Noted Resolved   Supervision of other normal pregnancy, antepartum 05/12/2021 by Mirna Mires, CNM No   Overview Addendum 06/02/2021  4:46 PM by Nadara Mustard, MD     Nursing Staff Provider  Office Location  [facility] Dating  LMP, confrimed w [place]  Language  English Anatomy US    Flu Vaccine  Oct 15, 22 Genetic Screen  NIPS:   TDaP vaccine    Hgb A1C or  GTT Third trimester :   Covid UTD   LAB RESULTS   Rhogam   Blood Type     Feeding Plan  Antibody    Contraception  Rubella    Circumcision  RPR     Pediatrician   HBsAg     Support Person Matt HIV    Prenatal Classes  Varicella Imm    GBS  (For PCN allergy, check sensitivities)   BTL Consent  PAP nml             Preterm labor symptoms and general obstetric precautions including but not limited to vaginal bleeding, contractions, leaking of fluid and fetal movement were reviewed in detail with the patient. Please refer to After Visit Summary for other counseling recommendations.  Her anatomy scan and ROB have already been scheduled.  No follow-ups on file.  Mirna Mires, CNM  06/30/2021 3:55 PM

## 2021-06-30 NOTE — Progress Notes (Signed)
No concerns.rj 

## 2021-07-01 LAB — RPR+RH+ABO+RUB AB+AB SCR+CB...
Antibody Screen: NEGATIVE
HIV Screen 4th Generation wRfx: NONREACTIVE
Hematocrit: 35.2 % (ref 34.0–46.6)
Hemoglobin: 12.2 g/dL (ref 11.1–15.9)
Hepatitis B Surface Ag: NEGATIVE
MCH: 30 pg (ref 26.6–33.0)
MCHC: 34.7 g/dL (ref 31.5–35.7)
MCV: 87 fL (ref 79–97)
Platelets: 221 10*3/uL (ref 150–450)
RBC: 4.07 x10E6/uL (ref 3.77–5.28)
RDW: 12.9 % (ref 11.7–15.4)
RPR Ser Ql: NONREACTIVE
Rh Factor: POSITIVE
Rubella Antibodies, IGG: 2 index (ref >0.99)
Varicella zoster IgG: 2242 index (ref >165)
WBC: 6.1 10*3/uL (ref 3.4–10.8)

## 2021-07-20 ENCOUNTER — Other Ambulatory Visit: Payer: Self-pay

## 2021-07-20 ENCOUNTER — Ambulatory Visit
Admission: RE | Admit: 2021-07-20 | Discharge: 2021-07-20 | Disposition: A | Discharge status: Home / Self Care | Payer: BC Managed Care – PPO | Source: Ambulatory Visit | Attending: Obstetrics & Gynecology | Admitting: Obstetrics & Gynecology

## 2021-07-20 DIAGNOSIS — Z3689 Encounter for other specified antenatal screening: Secondary | ICD-10-CM | POA: Insufficient documentation

## 2021-07-31 ENCOUNTER — Other Ambulatory Visit: Payer: Self-pay

## 2021-07-31 ENCOUNTER — Ambulatory Visit (INDEPENDENT_AMBULATORY_CARE_PROVIDER_SITE_OTHER): Payer: BC Managed Care – PPO | Admitting: Obstetrics

## 2021-07-31 VITALS — BP 110/70 | Wt 122.0 lb

## 2021-07-31 DIAGNOSIS — Z3480 Encounter for supervision of other normal pregnancy, unspecified trimester: Secondary | ICD-10-CM

## 2021-07-31 DIAGNOSIS — Z3A20 20 weeks gestation of pregnancy: Secondary | ICD-10-CM

## 2021-07-31 NOTE — Progress Notes (Signed)
        Routine Prenatal Care Visit  Subjective  Helen Black is a 30 y.o. G2P1001 at [redacted]w[redacted]d being seen today for ongoing prenatal care.  She is currently monitored for the following issues for this low-risk pregnancy and has Female infertility associated with anovulation; Amenorrhea; Delayed gastric emptying; Early satiety; Family history of thyroid disease in sister; Third degree laceration of perineum, type 3a; and Supervision of other normal pregnancy, antepartum on their problem list.  ----------------------------------------------------------------------------------- Patient reports no complaints.   Contractions: Not present. Vag. Bleeding: None.   . Leaking Fluid denies.  ----------------------------------------------------------------------------------- The following portions of the patient's history were reviewed and updated as appropriate: allergies, current medications, past family history, past medical history, past social history, past surgical history and problem list. Problem list updated.  Objective  Blood pressure 110/70, weight 122 lb (55.3 kg), last menstrual period 03/09/2021. Pregravid weight 110 lb (49.9 kg) Total Weight Gain 12 lb (5.443 kg) Urinalysis: Urine Protein    Urine Glucose    Fetal Status:           General:  Alert, oriented and cooperative. Patient is in no acute distress.  Skin: Skin is warm and dry. No rash noted.   Cardiovascular: Normal heart rate noted  Respiratory: Normal respiratory effort, no problems with respiration noted  Abdomen: Soft, gravid, appropriate for gestational age.       Pelvic:  Cervical exam deferred        Extremities: Normal range of motion.     Mental Status: Normal mood and affect. Normal behavior. Normal judgment and thought content.   Assessment   29 y.o. G2P1001 at [redacted]w[redacted]d by  12/14/2021, by Last Menstrual Period presenting for routine prenatal visit  Plan   pregnancy 2 Problems (from 05/12/21 to present)     Problem Noted Resolved   Supervision of other normal pregnancy, antepartum 05/12/2021 by Mirna Mires, CNM No   Overview Addendum 07/01/2021 11:01 PM by Mirna Mires, CNM     Nursing Staff Provider  Office Location  [facility] Dating  LMP, confrimed w [place]  Language  English Anatomy US    Flu Vaccine  Oct 15, 22 Genetic Screen  NIPS:   TDaP vaccine    Hgb A1C or  GTT Third trimester :   Covid UTD   LAB RESULTS   Rhogam   Blood Type   B+  Feeding Plan  Antibody  negative  Contraception  Rubella  immune  Circumcision  RPR   NR  Pediatrician   HBsAg   negative  Support Person Matt HIV  negative  Prenatal Classes  Varicella Immune    GBS  (For PCN allergy, check sensitivities)   BTL Consent  PAP nml             Preterm labor symptoms and general obstetric precautions including but not limited to vaginal bleeding, contractions, leaking of fluid and fetal movement were reviewed in detail with the patient. Please refer to After Visit Summary for other counseling recommendations.  Discussed her thoughts re another SVD vs opting for a primary CS.   No follow-ups on file.  Mirna Mires, CNM  07/31/2021 4:18 PM

## 2021-07-31 NOTE — Progress Notes (Signed)
No vb. No lof.  

## 2021-08-16 ENCOUNTER — Encounter: Payer: Self-pay | Admitting: Obstetrics

## 2021-08-17 NOTE — Telephone Encounter (Signed)
Per Radiology note No fetal anomalies identified, although distal spine and cardiac outflow tracts could not be visualized. Consider followup ultrasound in 3-4 weeks to complete anatomic evaluation.  I am assuming patient read this and believes she need another follow up. Please advise

## 2021-08-17 NOTE — Telephone Encounter (Signed)
Do you schedule these?

## 2021-08-19 ENCOUNTER — Other Ambulatory Visit: Payer: Self-pay | Admitting: Obstetrics

## 2021-08-19 DIAGNOSIS — Z3480 Encounter for supervision of other normal pregnancy, unspecified trimester: Secondary | ICD-10-CM

## 2021-08-28 ENCOUNTER — Ambulatory Visit (INDEPENDENT_AMBULATORY_CARE_PROVIDER_SITE_OTHER): Payer: BC Managed Care – PPO | Admitting: Obstetrics

## 2021-08-28 ENCOUNTER — Other Ambulatory Visit: Payer: Self-pay

## 2021-08-28 VITALS — BP 98/58 | Wt 128.0 lb

## 2021-08-28 DIAGNOSIS — Z3480 Encounter for supervision of other normal pregnancy, unspecified trimester: Secondary | ICD-10-CM

## 2021-08-28 DIAGNOSIS — Z3A24 24 weeks gestation of pregnancy: Secondary | ICD-10-CM

## 2021-08-28 NOTE — Progress Notes (Signed)
  Routine Prenatal Care Visit  Subjective  Helen Black is a 30 y.o. G2P1001 at [redacted]w[redacted]d being seen today for ongoing prenatal care.  She is currently monitored for the following issues for this low-risk pregnancy and has Female infertility associated with anovulation; Amenorrhea; Delayed gastric emptying; Early satiety; Family history of thyroid disease in sister; Third degree laceration of perineum, type 3a; and Supervision of other normal pregnancy, antepartum on their problem list.  ----------------------------------------------------------------------------------- Patient reports no complaints.   Contractions: Not present. Vag. Bleeding: None.  Movement: Present. Leaking Fluid denies.  ----------------------------------------------------------------------------------- The following portions of the patient's history were reviewed and updated as appropriate: allergies, current medications, past family history, past medical history, past social history, past surgical history and problem list. Problem list updated.  Objective  Blood pressure (!) 98/58, weight 128 lb (58.1 kg), last menstrual period 03/09/2021. Pregravid weight 110 lb (49.9 kg) Total Weight Gain 18 lb (8.165 kg) Urinalysis: Urine Protein    Urine Glucose    Fetal Status: Fetal Heart Rate (bpm): 147 Fundal Height: 24 cm Movement: Present     General:  Alert, oriented and cooperative. Patient is in no acute distress.  Skin: Skin is warm and dry. No rash noted.   Cardiovascular: Normal heart rate noted  Respiratory: Normal respiratory effort, no problems with respiration noted  Abdomen: Soft, gravid, appropriate for gestational age. Pain/Pressure: Absent     Pelvic:  Cervical exam deferred        Extremities: Normal range of motion.  Edema: None  Mental Status: Normal mood and affect. Normal behavior. Normal judgment and thought content.   Assessment   30 y.o. G2P1001 at [redacted]w[redacted]d by  12/14/2021, by Last Menstrual Period  presenting for routine prenatal visit  Plan   pregnancy 2 Problems (from 05/12/21 to present)    Problem Noted Resolved   Supervision of other normal pregnancy, antepartum 05/12/2021 by Mirna Mires, CNM No   Overview Addendum 07/01/2021 11:01 PM by Mirna Mires, CNM     Nursing Staff Provider  Office Location  [facility] Dating  LMP, confrimed w [place]  Language  English Anatomy US    Flu Vaccine  Oct 15, 22 Genetic Screen  NIPS:   TDaP vaccine    Hgb A1C or  GTT Third trimester :   Covid UTD   LAB RESULTS   Rhogam   Blood Type   B+  Feeding Plan  Antibody  negative  Contraception  Rubella  immune  Circumcision  RPR   NR  Pediatrician   HBsAg   negative  Support Person Matt HIV  negative  Prenatal Classes  Varicella Immune    GBS  (For PCN allergy, check sensitivities)   BTL Consent  PAP nml             Preterm labor symptoms and general obstetric precautions including but not limited to vaginal bleeding, contractions, leaking of fluid and fetal movement were reviewed in detail with the patient. Please refer to After Visit Summary for other counseling recommendations.   Return in about 4 weeks (around 09/25/2021) for return OB, 28 week labs.  Mirna Mires, CNM  08/28/2021 4:22 PM

## 2021-09-05 ENCOUNTER — Ambulatory Visit
Admission: RE | Admit: 2021-09-05 | Discharge: 2021-09-05 | Disposition: A | Discharge status: Home / Self Care | Payer: BC Managed Care – PPO | Source: Ambulatory Visit | Attending: Obstetrics | Admitting: Obstetrics

## 2021-09-05 ENCOUNTER — Other Ambulatory Visit: Payer: Self-pay

## 2021-09-05 DIAGNOSIS — Z3482 Encounter for supervision of other normal pregnancy, second trimester: Secondary | ICD-10-CM | POA: Insufficient documentation

## 2021-09-05 DIAGNOSIS — Z3480 Encounter for supervision of other normal pregnancy, unspecified trimester: Secondary | ICD-10-CM | POA: Diagnosis present

## 2021-09-06 ENCOUNTER — Encounter: Payer: Self-pay | Admitting: Obstetrics

## 2021-09-17 NOTE — L&D Delivery Note (Signed)
Delivery Note ?Primary OB: [facility] ?Delivery Physician: Annamarie Major, MD ?Gestational Age: Full term ?Antepartum complications: none ?Intrapartum complications: None ? ?A viable Female was delivered via vertex presentation. "[place]" ?Apgars:8 ,9  ?Weight:  pending .   ?Placenta status: spontaneous and Intact.  Cord: 3+ vessels;  with the following complications: none. ? ?Anesthesia:  epidural ?Episiotomy:  none ?Lacerations:  1st ?Suture Repair: 2.0 vicryl ?Est. Blood Loss (mL):  less than 100 mL ? ?Mom to postpartum.  Baby to Couplet care / Skin to Skin. ? ?Annamarie Major, MD, FACOG ?[facility], [facility] ?12/02/2021  8:21 PM ?[phone] ? ?

## 2021-09-25 ENCOUNTER — Other Ambulatory Visit: Payer: Self-pay

## 2021-09-25 ENCOUNTER — Ambulatory Visit (INDEPENDENT_AMBULATORY_CARE_PROVIDER_SITE_OTHER): Payer: BC Managed Care – PPO | Admitting: Obstetrics

## 2021-09-25 ENCOUNTER — Other Ambulatory Visit: Payer: BC Managed Care – PPO

## 2021-09-25 VITALS — BP 96/56 | Wt 128.0 lb

## 2021-09-25 DIAGNOSIS — Z3A28 28 weeks gestation of pregnancy: Secondary | ICD-10-CM

## 2021-09-25 DIAGNOSIS — Z3480 Encounter for supervision of other normal pregnancy, unspecified trimester: Secondary | ICD-10-CM

## 2021-09-25 LAB — POCT URINALYSIS DIPSTICK OB
Glucose, UA: NEGATIVE
POC,PROTEIN,UA: NEGATIVE

## 2021-09-25 NOTE — Progress Notes (Signed)
Routine Prenatal Care Visit  Subjective  Helen Black is a 31 y.o. G2P1001 at [redacted]w[redacted]d being seen today for ongoing prenatal care.  She is currently monitored for the following issues for this high-risk pregnancy and has Female infertility associated with anovulation; Amenorrhea; Delayed gastric emptying; Early satiety; Family history of thyroid disease in sister; Third degree laceration of perineum, type 3a; and Supervision of other normal pregnancy, antepartum on their problem list.  ----------------------------------------------------------------------------------- Patient reports no complaints.  Having her 28 week labs today. Contractions: Not present. Vag. Bleeding: None.  Movement: Present. Leaking Fluid denies.  ----------------------------------------------------------------------------------- The following portions of the patient's history were reviewed and updated as appropriate: allergies, current medications, past family history, past medical history, past social history, past surgical history and problem list. Problem list updated.  Objective  Blood pressure (!) 96/56, weight 128 lb (58.1 kg), last menstrual period 03/09/2021. Pregravid weight 110 lb (49.9 kg) Total Weight Gain 18 lb (8.165 kg) Urinalysis: Urine Protein Negative  Urine Glucose Negative  Fetal Status:     Movement: Present     General:  Alert, oriented and cooperative. Patient is in no acute distress.  Skin: Skin is warm and dry. No rash noted.   Cardiovascular: Normal heart rate noted  Respiratory: Normal respiratory effort, no problems with respiration noted  Abdomen: Soft, gravid, appropriate for gestational age. Pain/Pressure: Absent     Pelvic:  Cervical exam deferred        Extremities: Normal range of motion.     Mental Status: Normal mood and affect. Normal behavior. Normal judgment and thought content.   Assessment   31 y.o. G2P1001 at [redacted]w[redacted]d by  12/14/2021, by Last Menstrual Period presenting for  routine prenatal visit  Plan   pregnancy 2 Problems (from 05/12/21 to present)    Problem Noted Resolved   Supervision of other normal pregnancy, antepartum 05/12/2021 by Mirna Mires, CNM No   Overview Addendum 09/06/2021 10:48 PM by Mirna Mires, CNM     Nursing Staff Provider  Office Location  [facility] Dating  LMP, confrimed w [place]  Language  English Anatomy US  Normal anatomy  Flu Vaccine  Oct 15, 22 Genetic Screen  NIPS: xy  TDaP vaccine    Hgb A1C or  GTT Third trimester :   Covid UTD   LAB RESULTS   Rhogam   Blood Type   B+  Feeding Plan  Antibody  negative  Contraception  Rubella  immune  Circumcision  RPR   NR  Pediatrician   HBsAg   negative  Support Person Matt HIV  negative  Prenatal Classes  Varicella Immune    GBS  (For PCN allergy, check sensitivities)   BTL Consent  PAP nml             Preterm labor symptoms and general obstetric precautions including but not limited to vaginal bleeding, contractions, leaking of fluid and fetal movement were reviewed in detail with the patient. Please refer to After Visit Summary for other counseling recommendations.   Return in about 2 weeks (around 10/09/2021) for return OB.  Mirna Mires, CNM  09/25/2021 3:51 PM

## 2021-09-26 LAB — 28 WEEK RH+PANEL
Basophils Absolute: 0 10*3/uL (ref 0.0–0.2)
Basos: 0 %
EOS (ABSOLUTE): 0 10*3/uL (ref 0.0–0.4)
Eos: 1 %
Gestational Diabetes Screen: 123 mg/dL (ref 70–139)
HIV Screen 4th Generation wRfx: NONREACTIVE
Hematocrit: 33.6 % — ABNORMAL LOW (ref 34.0–46.6)
Hemoglobin: 11.5 g/dL (ref 11.1–15.9)
Immature Grans (Abs): 0.1 10*3/uL (ref 0.0–0.1)
Immature Granulocytes: 1 %
Lymphocytes Absolute: 1.6 10*3/uL (ref 0.7–3.1)
Lymphs: 26 %
MCH: 29.8 pg (ref 26.6–33.0)
MCHC: 34.2 g/dL (ref 31.5–35.7)
MCV: 87 fL (ref 79–97)
Monocytes Absolute: 0.4 10*3/uL (ref 0.1–0.9)
Monocytes: 6 %
Neutrophils Absolute: 4.1 10*3/uL (ref 1.4–7.0)
Neutrophils: 66 %
Platelets: 227 10*3/uL (ref 150–450)
RBC: 3.86 x10E6/uL (ref 3.77–5.28)
RDW: 11.7 % (ref 11.7–15.4)
RPR Ser Ql: NONREACTIVE
WBC: 6.3 10*3/uL (ref 3.4–10.8)

## 2021-10-09 ENCOUNTER — Ambulatory Visit (INDEPENDENT_AMBULATORY_CARE_PROVIDER_SITE_OTHER): Payer: BC Managed Care – PPO | Admitting: Advanced Practice Midwife

## 2021-10-09 ENCOUNTER — Other Ambulatory Visit: Payer: Self-pay

## 2021-10-09 ENCOUNTER — Encounter: Payer: Self-pay | Admitting: Advanced Practice Midwife

## 2021-10-09 VITALS — BP 108/70 | Ht 67.0 in | Wt 127.6 lb

## 2021-10-09 DIAGNOSIS — Z3A30 30 weeks gestation of pregnancy: Secondary | ICD-10-CM

## 2021-10-09 DIAGNOSIS — Z3483 Encounter for supervision of other normal pregnancy, third trimester: Secondary | ICD-10-CM

## 2021-10-09 DIAGNOSIS — Z23 Encounter for immunization: Secondary | ICD-10-CM

## 2021-10-09 LAB — POCT URINALYSIS DIPSTICK OB
Glucose, UA: NEGATIVE
POC,PROTEIN,UA: NEGATIVE

## 2021-10-09 NOTE — Progress Notes (Signed)
tdap

## 2021-10-09 NOTE — Progress Notes (Signed)
Routine Prenatal Care Visit  Subjective  Helen Black is a 31 y.o. G2P1001 at [redacted]w[redacted]d being seen today for ongoing prenatal care.  She is currently monitored for the following issues for this low-risk pregnancy and has Female infertility associated with anovulation; Amenorrhea; Delayed gastric emptying; Early satiety; Family history of thyroid disease in sister; Third degree laceration of perineum, type 3a; Supervision of other normal pregnancy, antepartum; and Underweight on their problem list.  ----------------------------------------------------------------------------------- Patient reports no complaints.   Contractions: Not present. Vag. Bleeding: None.  Movement: Present. Leaking Fluid denies.  ----------------------------------------------------------------------------------- The following portions of the patient's history were reviewed and updated as appropriate: allergies, current medications, past family history, past medical history, past social history, past surgical history and problem list. Problem list updated.  Objective  Blood pressure 108/70, height 5\' 7"  (1.702 m), weight 127 lb 9.6 oz (57.9 kg), last menstrual period 03/09/2021. Pregravid weight 110 lb (49.9 kg) Total Weight Gain 17 lb 9.6 oz (7.983 kg) Urinalysis: Urine Protein Negative  Urine Glucose Negative  Fetal Status: Fetal Heart Rate (bpm): 141 Fundal Height: 30 cm Movement: Present     General:  Alert, oriented and cooperative. Patient is in no acute distress.  Skin: Skin is warm and dry. No rash noted.   Cardiovascular: Normal heart rate noted  Respiratory: Normal respiratory effort, no problems with respiration noted  Abdomen: Soft, gravid, appropriate for gestational age. Pain/Pressure: Absent     Pelvic:  Cervical exam deferred        Extremities: Normal range of motion.  Edema: None  Mental Status: Normal mood and affect. Normal behavior. Normal judgment and thought content.   Assessment   31 y.o.  G2P1001 at [redacted]w[redacted]d by  12/14/2021, by Last Menstrual Period presenting for routine prenatal visit  Plan   pregnancy 2 Problems (from 05/12/21 to present)    Problem Noted Resolved   Supervision of other normal pregnancy, antepartum 05/12/2021 by 05/14/2021, CNM No   Overview Addendum 10/09/2021  4:24 PM by 10/11/2021, CNM     Nursing Staff Provider  Office Location  [facility] Dating  LMP, confrimed w [place]  Language  English Anatomy US  Normal anatomy  Flu Vaccine  Oct 15, 22 Genetic Screen  NIPS: xy  TDaP vaccine   10/09/21 Hgb A1C or  GTT Third trimester : 123  Covid UTD   LAB RESULTS   Rhogam   Blood Type   B+  Feeding Plan  Antibody  negative  Contraception  Rubella  immune  Circumcision  RPR   NR  Pediatrician   HBsAg   negative  Support Person Matt HIV  negative  Prenatal Classes  Varicella Immune    GBS  (For PCN allergy, check sensitivities)   BTL Consent  PAP nml             Preterm labor symptoms and general obstetric precautions including but not limited to vaginal bleeding, contractions, leaking of fluid and fetal movement were reviewed in detail with the patient.    Return in about 2 weeks (around 10/23/2021) for rob.  12/21/2021, CNM 10/09/2021 4:31 PM

## 2021-10-23 ENCOUNTER — Other Ambulatory Visit: Payer: Self-pay

## 2021-10-23 ENCOUNTER — Encounter: Payer: Self-pay | Admitting: Licensed Practical Nurse

## 2021-10-23 ENCOUNTER — Ambulatory Visit (INDEPENDENT_AMBULATORY_CARE_PROVIDER_SITE_OTHER): Payer: BC Managed Care – PPO | Admitting: Licensed Practical Nurse

## 2021-10-23 VITALS — BP 112/60 | Wt 126.0 lb

## 2021-10-23 DIAGNOSIS — Z3A32 32 weeks gestation of pregnancy: Secondary | ICD-10-CM

## 2021-10-23 DIAGNOSIS — Z3480 Encounter for supervision of other normal pregnancy, unspecified trimester: Secondary | ICD-10-CM

## 2021-10-23 NOTE — Progress Notes (Signed)
Routine Prenatal Care Visit  Subjective  Helen Black is a 31 y.o. G2P1001 at [redacted]w[redacted]d being seen today for ongoing prenatal care.  She is currently monitored for the following issues for this low-risk pregnancy and has Female infertility associated with anovulation; Amenorrhea; Delayed gastric emptying; Early satiety; Family history of thyroid disease in sister; Third degree laceration of perineum, type 3a; Supervision of other normal pregnancy, antepartum; and Underweight on their problem list.  ----------------------------------------------------------------------------------- Patient reports no complaints.  Undecided about cir, reviewed current recommendations.  Contractions: Not present. Vag. Bleeding: None.  Movement: Present. Leaking Fluid denies.  ----------------------------------------------------------------------------------- The following portions of the patient's history were reviewed and updated as appropriate: allergies, current medications, past family history, past medical history, past social history, past surgical history and problem list. Problem list updated.  Objective  Blood pressure 112/60, weight 126 lb (57.2 kg), last menstrual period 03/09/2021. Pregravid weight 110 lb (49.9 kg) Total Weight Gain 16 lb (7.258 kg) Urinalysis: Urine Protein    Urine Glucose    Fetal Status:     Movement: Present     General:  Alert, oriented and cooperative. Patient is in no acute distress.  Skin: Skin is warm and dry. No rash noted.   Cardiovascular: Normal heart rate noted  Respiratory: Normal respiratory effort, no problems with respiration noted  Abdomen: Soft, gravid, appropriate for gestational age. Pain/Pressure: Absent     Pelvic:  Cervical exam deferred        Extremities: Normal range of motion.     Mental Status: Normal mood and affect. Normal behavior. Normal judgment and thought content.   Assessment   31 y.o. G2P1001 at [redacted]w[redacted]d by  12/14/2021, by Last Menstrual Period  presenting for routine prenatal visit  Plan   pregnancy 2 Problems (from 05/12/21 to present)     Problem Noted Resolved   Supervision of other normal pregnancy, antepartum 05/12/2021 by Mirna Mires, CNM No   Overview Addendum 10/09/2021  4:24 PM by [place], CNM     Nursing Staff Provider  Office Location  [facility] Dating  LMP, confrimed w [place]  Language  English Anatomy US  Normal anatomy  Flu Vaccine  Oct 15, 22 Genetic Screen  NIPS: xy  TDaP vaccine   10/09/21 Hgb A1C or  GTT Third trimester : 123  Covid UTD   LAB RESULTS   Rhogam   Blood Type   B+  Feeding Plan  Antibody  negative  Contraception  Rubella  immune  Circumcision  RPR   NR  Pediatrician   HBsAg   negative  Support Person Matt HIV  negative  Prenatal Classes  Varicella Immune    GBS  (For PCN allergy, check sensitivities)   BTL Consent  PAP nml              Preterm labor symptoms and general obstetric precautions including but not limited to vaginal bleeding, contractions, leaking of fluid and fetal movement were reviewed in detail with the patient. Please refer to After Visit Summary for other counseling recommendations.   Return in about 1 week (around 10/30/2021) for ROB.  If FH 30cm or 3cm or more different than gestational weeks will order growth [place].  Carie Caddy, CNM  [facility], [facility]  10/23/21  6:06 PM

## 2021-10-30 ENCOUNTER — Other Ambulatory Visit: Payer: Self-pay

## 2021-10-30 ENCOUNTER — Ambulatory Visit (INDEPENDENT_AMBULATORY_CARE_PROVIDER_SITE_OTHER): Payer: BC Managed Care – PPO | Admitting: Obstetrics

## 2021-10-30 VITALS — BP 110/64 | Wt 128.0 lb

## 2021-10-30 DIAGNOSIS — O26843 Uterine size-date discrepancy, third trimester: Secondary | ICD-10-CM

## 2021-10-30 DIAGNOSIS — Z3480 Encounter for supervision of other normal pregnancy, unspecified trimester: Secondary | ICD-10-CM

## 2021-10-30 DIAGNOSIS — Z3A33 33 weeks gestation of pregnancy: Secondary | ICD-10-CM

## 2021-10-30 NOTE — Progress Notes (Signed)
Routine Prenatal Care Visit  Subjective  Helen Black is a 31 y.o. G2P1001 at [redacted]w[redacted]d being seen today for ongoing prenatal care.  She is currently monitored for the following issues for this high-risk pregnancy and has Female infertility associated with anovulation; Amenorrhea; Delayed gastric emptying; Early satiety; Family history of thyroid disease in sister; Third degree laceration of perineum, type 3a; Supervision of other normal pregnancy, antepartum; and Underweight on their problem list.  ----------------------------------------------------------------------------------- Patient reports  Several weeks of swollen ankels and feet. Her blood pressures are WNL, and she denies any headache or visual changes. She stands a lot as a Pharmacist, hospital. Contractions: Not present. Vag. Bleeding: None.   . Leaking Fluid denies.  ----------------------------------------------------------------------------------- The following portions of the patient's history were reviewed and updated as appropriate: allergies, current medications, past family history, past medical history, past social history, past surgical history and problem list. Problem list updated.  Objective  Blood pressure 110/64, weight 128 lb (58.1 kg), last menstrual period 03/09/2021. Pregravid weight 110 lb (49.9 kg) Total Weight Gain 18 lb (8.165 kg) Urinalysis: Urine Protein    Urine Glucose    Fetal Status:           General:  Alert, oriented and cooperative. Patient is in no acute distress.  Skin: Skin is warm and dry. No rash noted.   Cardiovascular: Normal heart rate noted  Respiratory: Normal respiratory effort, no problems with respiration noted  Abdomen: Soft, gravid, appropriate for gestational age. Pain/Pressure: Absent     Pelvic:  Cervical exam deferred        Extremities: Normal range of motion.     Mental Status: Normal mood and affect. Normal behavior. Normal judgment and thought content.   Assessment   31 y.o. G2P1001 at  109w4d by  12/14/2021, by Last Menstrual Period presenting for routine prenatal visit  Plan   pregnancy 2 Problems (from 05/12/21 to present)    Problem Noted Resolved   Supervision of other normal pregnancy, antepartum 05/12/2021 by Imagene Riches, CNM No   Overview Addendum 10/09/2021  4:24 PM by Rod Can, CNM     Nursing Staff Provider  Office Location  [facility] Dating  LMP, confrimed w [place]  Language  English Anatomy US  Normal anatomy  Flu Vaccine  Oct 15, 22 Genetic Screen  NIPS: xy  TDaP vaccine   10/09/21 Hgb A1C or  GTT Third trimester : 123  Covid UTD   LAB RESULTS   Rhogam   Blood Type   B+  Feeding Plan  Antibody  negative  Contraception  Rubella  immune  Circumcision  RPR   NR  Pediatrician   HBsAg   negative  Support Person Matt HIV  negative  Prenatal Classes  Varicella Immune    GBS  (For PCN allergy, check sensitivities)   BTL Consent  PAP nml             Preterm labor symptoms and general obstetric precautions including but not limited to vaginal bleeding, contractions, leaking of fluid and fetal movement were reviewed in detail with the patient. Please refer to After Visit Summary for other counseling recommendations.  Continues to measure a bit smaller- will order a growth scan. RTC in 2 weeks.  No follow-ups on file.  Imagene Riches, CNM  10/30/2021 4:21 PM

## 2021-10-30 NOTE — Progress Notes (Signed)
No vb. No lof.  

## 2021-11-15 ENCOUNTER — Encounter: Payer: BC Managed Care – PPO | Admitting: Licensed Practical Nurse

## 2021-11-16 ENCOUNTER — Other Ambulatory Visit: Payer: Self-pay

## 2021-11-16 ENCOUNTER — Ambulatory Visit (INDEPENDENT_AMBULATORY_CARE_PROVIDER_SITE_OTHER): Payer: BC Managed Care – PPO

## 2021-11-16 DIAGNOSIS — O26843 Uterine size-date discrepancy, third trimester: Secondary | ICD-10-CM | POA: Diagnosis not present

## 2021-11-16 DIAGNOSIS — Z3A35 35 weeks gestation of pregnancy: Secondary | ICD-10-CM

## 2021-11-16 DIAGNOSIS — Z3480 Encounter for supervision of other normal pregnancy, unspecified trimester: Secondary | ICD-10-CM | POA: Diagnosis not present

## 2021-11-16 DIAGNOSIS — Z3483 Encounter for supervision of other normal pregnancy, third trimester: Secondary | ICD-10-CM | POA: Diagnosis not present

## 2021-11-17 ENCOUNTER — Encounter: Payer: Self-pay | Admitting: Obstetrics

## 2021-11-17 ENCOUNTER — Encounter: Payer: BC Managed Care – PPO | Admitting: Licensed Practical Nurse

## 2021-11-21 ENCOUNTER — Other Ambulatory Visit: Payer: Self-pay

## 2021-11-21 ENCOUNTER — Encounter: Payer: Self-pay | Admitting: Licensed Practical Nurse

## 2021-11-21 ENCOUNTER — Ambulatory Visit (INDEPENDENT_AMBULATORY_CARE_PROVIDER_SITE_OTHER): Payer: BC Managed Care – PPO | Admitting: Licensed Practical Nurse

## 2021-11-21 VITALS — BP 100/60 | Wt 128.6 lb

## 2021-11-21 DIAGNOSIS — Z3480 Encounter for supervision of other normal pregnancy, unspecified trimester: Secondary | ICD-10-CM

## 2021-11-21 DIAGNOSIS — Z3A36 36 weeks gestation of pregnancy: Secondary | ICD-10-CM

## 2021-11-21 DIAGNOSIS — Z3685 Encounter for antenatal screening for Streptococcus B: Secondary | ICD-10-CM

## 2021-11-21 LAB — POCT URINALYSIS DIPSTICK OB
Glucose, UA: NEGATIVE
POC,PROTEIN,UA: NEGATIVE

## 2021-11-21 NOTE — Progress Notes (Signed)
C/o swelling in legs, no red streaks. ?

## 2021-11-22 NOTE — Progress Notes (Signed)
Routine Prenatal Care Visit ? ?Subjective  ?Helen Black is a 31 y.o. G2P1001 at [redacted]w[redacted]d being seen today for ongoing prenatal care.  She is currently monitored for the following issues for this low-risk pregnancy and has Female infertility associated with anovulation; Amenorrhea; Delayed gastric emptying; Early satiety; Family history of thyroid disease in sister; Third degree laceration of perineum, type 3a; Supervision of other normal pregnancy, antepartum; and Underweight on their problem list.  ?----------------------------------------------------------------------------------- ?Patient reports  swelling in legs .  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 Comfort measures reviewed. ?-considering IUD for contraception, is pretty sure they are done having children.  ? ?Contractions: Not present.  .  Movement: Present. Leaking Fluid denies.  ?----------------------------------------------------------------------------------- ?The following portions of the patient's history were reviewed and updated as appropriate: allergies, current medications, past family history, past medical history, past social history, past surgical history and problem list. Problem list updated. ? ?Objective  ?Blood pressure 100/60, weight 128 lb 9.6 oz (58.3 kg), last menstrual period 03/09/2021. ?Pregravid weight 110 lb (49.9 kg) Total Weight Gain 18 lb 9.6 oz (8.437 kg) ?Urinalysis: Urine Protein Negative  Urine Glucose Negative ? ?Fetal Status: Fetal Heart Rate (bpm): 130 Fundal Height: 34 cm Movement: Present    ? ?General:  Alert, oriented and cooperative. Patient is in no acute distress.  ?Skin: Skin is warm and dry. No rash noted.   ?Cardiovascular: Normal heart rate noted  ?Respiratory: Normal respiratory effort, no problems with respiration noted  ?Abdomen: Soft, gravid, appropriate for  gestational age. Pain/Pressure: Absent     ?Pelvic:  Cervical exam deferred        ?Extremities: Normal range of motion.   +2 BLE up to ankles   ?Mental Status: Normal mood and affect. Normal behavior. Normal judgment and thought content.  ? ?Assessment  ? ?31 y.o. G2P1001 at [redacted]w[redacted]d by  12/14/2021, by Last Menstrual Period presenting for routine prenatal visit ? ?Plan  ? ?pregnancy 2 Problems (from 05/12/21 to present)   ? ? Problem Noted Resolved  ? Supervision of other normal pregnancy, antepartum 05/12/2021 by Mirna Mires, CNM No  ? Overview Addendum 11/22/2021 11:42 AM by Ellwood Sayers, CNM  ?   ?Nursing Staff Provider  ?Office Location  [facility] Dating  LMP, confrimed w [place]  ?Language  English Anatomy US  Normal anatomy  ?Flu Vaccine  Oct 15, 22 Genetic Screen  NIPS: xy  ?TDaP vaccine   10/09/21 Hgb A1C or  ?GTT Third trimester : 123  ?Covid UTD   LAB RESULTS   ?Rhogam  NA Blood Type B/Positive/-- (10/14 1606) B+  ?Feeding Plan Breast Antibody Negative (10/14 1606)negative  ?Contraception IUD Rubella 2.00 (10/14 1606)immune  ?Circumcision  RPR Non Reactive (01/09 1536) NR  ?Pediatrician  [facility] HBsAg Negative (10/14 1606) negative  ?Support Person Matt HIV Non Reactive (01/09 1536)negative  ?Prenatal Classes Multip  Varicella Immune  ?  GBS  (For PCN allergy, check sensitivities)   ?BTL Consent  PAP nml  ? ? ?  ?  ? ?  ?  ? ?Term labor symptoms and general obstetric precautions including but not limited to vaginal bleeding, contractions, leaking of fluid and fetal movement were reviewed in detail with the patient. ?Please refer to After Visit Summary for other counseling recommendations.  ? ?Return in about 1 week (around 11/28/2021) for  ROB. ?GBS collected, declined gc/ct screening today  ? ?Carie Caddy, CNM  ?[facility], [facility]  ?11/22/21  ?11:45 AM  ? ? ?

## 2021-11-25 LAB — CULTURE, BETA STREP (GROUP B ONLY): Strep Gp B Culture: NEGATIVE

## 2021-11-28 ENCOUNTER — Other Ambulatory Visit: Payer: Self-pay

## 2021-11-28 ENCOUNTER — Encounter: Payer: Self-pay | Admitting: Licensed Practical Nurse

## 2021-11-28 ENCOUNTER — Ambulatory Visit (INDEPENDENT_AMBULATORY_CARE_PROVIDER_SITE_OTHER): Payer: BC Managed Care – PPO | Admitting: Licensed Practical Nurse

## 2021-11-28 VITALS — BP 100/62 | Wt 125.0 lb

## 2021-11-28 DIAGNOSIS — Z3A37 37 weeks gestation of pregnancy: Secondary | ICD-10-CM

## 2021-11-28 DIAGNOSIS — R1032 Left lower quadrant pain: Secondary | ICD-10-CM

## 2021-11-28 DIAGNOSIS — Z3480 Encounter for supervision of other normal pregnancy, unspecified trimester: Secondary | ICD-10-CM

## 2021-11-28 LAB — POCT URINALYSIS DIPSTICK OB
Glucose, UA: NEGATIVE
POC,PROTEIN,UA: NEGATIVE

## 2021-11-28 NOTE — Progress Notes (Signed)
Routine Prenatal Care Visit ? ?Subjective  ?Helen Black is a 31 y.o. G2P1001 at [redacted]w[redacted]d being seen today for ongoing prenatal care.  She is currently monitored for the following issues for this low-risk pregnancy and has Female infertility associated with anovulation; Amenorrhea; Delayed gastric emptying; Early satiety; Family history of thyroid disease in sister; Third degree laceration of perineum, type 3a; Supervision of other normal pregnancy, antepartum; and Underweight on their problem list.  ?----------------------------------------------------------------------------------- ?Patient reports Here with partner. Has a lot of pain in her left groin area,   the pain started on Saturday and now it is worse, it is so bad that she can barely walk, there is no pain when sitting still, ut it hurts if she needs to move her leg.  She has been walking on a treadmill regularly. Denies any recent injury to the area. Is unable to lift her leg off of the exam table d/t the pain, able to move leg with passive motion.  Able to bear weight, able to lift her leg to take a step but it is painful. Denies any numbness or tingling in extremities.  ?Contractions: Not present. Vag. Bleeding: None.  Movement: Present. Leaking Fluid denies.  ?----------------------------------------------------------------------------------- ?The following portions of the patient's history were reviewed and updated as appropriate: allergies, current medications, past family history, past medical history, past social history, past surgical history and problem list. Problem list updated. ? ?Objective  ?Blood pressure 100/62, weight 125 lb (56.7 kg), last menstrual period 03/09/2021. ?Pregravid weight 110 lb (49.9 kg) Total Weight Gain 15 lb (6.804 kg) ?Urinalysis: Urine Protein Negative  Urine Glucose Negative ? ?Fetal Status: Fetal Heart Rate (bpm): 135 Fundal Height: 35 cm Movement: Present    ? ?Groin: Both left and right side: skin intact, no  redness or masses, non-tender to palpation, no swelling.  ?Pt obviously uncomfortable with walking  ? ?General:  Alert, oriented and cooperative. Patient is in no acute distress.  ?Skin: Skin is warm and dry. No rash noted.   ?Cardiovascular: Normal heart rate noted  ?Respiratory: Normal respiratory effort, no problems with respiration noted  ?Abdomen: Soft, gravid, appropriate for gestational age. Pain/Pressure: Absent     ?Pelvic:  Cervical exam deferred        ?Extremities: Normal range of motion.     ?Mental Status: Normal mood and affect. Normal behavior. Normal judgment and thought content.  ? ?Assessment  ? ?31 y.o. G2P1001 at [redacted]w[redacted]d by  12/14/2021, by Last Menstrual Period presenting for work-in prenatal visit ?Left inguinal pain ? ?Plan  ? ?pregnancy 2 Problems (from 05/12/21 to present)   ? ? Problem Noted Resolved  ? Supervision of other normal pregnancy, antepartum 05/12/2021 by Imagene Riches, CNM No  ? Overview Addendum 11/22/2021 11:42 AM by Allen Derry, CNM  ?   ?Nursing Staff Provider  ?Office Location  [facility] Dating  LMP, confrimed w [place]  ?Language  English Anatomy US  Normal anatomy  ?Flu Vaccine  Oct 15, 22 Genetic Screen  NIPS: xy  ?TDaP vaccine   10/09/21 Hgb A1C or  ?GTT Third trimester : 123  ?Covid UTD   LAB RESULTS   ?Rhogam  NA Blood Type B/Positive/-- (10/14 1606) B+  ?Feeding Plan Breast Antibody Negative (10/14 1606)negative  ?Contraception IUD Rubella 2.00 (10/14 1606)immune  ?Circumcision  RPR Non Reactive (01/09 1536) NR  ?Pediatrician  [facility] HBsAg Negative (10/14 1606) negative  ?Support Person Matt HIV Non Reactive (01/09 1536)negative  ?Prenatal Classes Multip  Varicella  Immune  ?  GBS  (For PCN allergy, check sensitivities)   ?BTL Consent  PAP nml  ? ? ?  ?  ? ?  ?  ? ?Term labor symptoms and general obstetric precautions including but not limited to vaginal bleeding, contractions, leaking of fluid and fetal movement were reviewed in detail with the patient. ?Please  refer to After Visit Summary for other counseling recommendations.  ? ?Comfort measures reviewed, encouraged pt to go to [facility] for evaluation and treatment ?Rest is important, pt desires to continue to work as she is able to sit once she is in the building, ok to go to work and reevaluate at the end of the week.  ? ?Keep next ROB  ? ?Roberto Scales, CNM  ?Mosetta Pigeon, [facility]  ?11/28/21  ?4:20 PM  ? ? ?

## 2021-11-28 NOTE — Telephone Encounter (Signed)
Contacted patient via phone. Voicemail is full unable to leave message. 

## 2021-12-01 ENCOUNTER — Encounter: Payer: BC Managed Care – PPO | Admitting: Licensed Practical Nurse

## 2021-12-01 ENCOUNTER — Encounter: Payer: Self-pay | Admitting: Licensed Practical Nurse

## 2021-12-01 ENCOUNTER — Other Ambulatory Visit: Payer: Self-pay

## 2021-12-01 ENCOUNTER — Ambulatory Visit (INDEPENDENT_AMBULATORY_CARE_PROVIDER_SITE_OTHER): Payer: BC Managed Care – PPO | Admitting: Licensed Practical Nurse

## 2021-12-01 VITALS — BP 120/62 | Wt 124.0 lb

## 2021-12-01 DIAGNOSIS — Z3480 Encounter for supervision of other normal pregnancy, unspecified trimester: Secondary | ICD-10-CM

## 2021-12-01 DIAGNOSIS — Z3A38 38 weeks gestation of pregnancy: Secondary | ICD-10-CM

## 2021-12-01 NOTE — Progress Notes (Signed)
Routine Prenatal Care Visit ? ?Subjective  ?Helen Black is a 31 y.o. G2P1001 at [redacted]w[redacted]d being seen today for ongoing prenatal care.  She is currently monitored for the following issues for this low-risk pregnancy and has Female infertility associated with anovulation; Amenorrhea; Delayed gastric emptying; Early satiety; Family history of thyroid disease in sister; Third degree laceration of perineum, type 3a; Supervision of other normal pregnancy, antepartum; and Underweight on their problem list.  ?----------------------------------------------------------------------------------- ?Patient reports had some pink mucus like discharge today, no contractions. Reports groin pain is improving, able to walk a little more easily-she spoke with a friend that is  a PT and was given stretches.  Today was her last day of work.    ?Contractions: Not present. Vag. Bleeding: None.  Movement: Present. Leaking Fluid denies.  ?----------------------------------------------------------------------------------- ?The following portions of the patient's history were reviewed and updated as appropriate: allergies, current medications, past family history, past medical history, past social history, past surgical history and problem list. Problem list updated. ? ?Objective  ?Blood pressure 120/62, weight 124 lb (56.2 kg), last menstrual period 03/09/2021. ?Pregravid weight 110 lb (49.9 kg) Total Weight Gain 14 lb (6.35 kg) ?Urinalysis: Urine Protein    Urine Glucose   ? ?Fetal Status: Fetal Heart Rate (bpm): 150 Fundal Height: 35 cm Movement: Present    ? ?General:  Alert, oriented and cooperative. Patient is in no acute distress.  ?Skin: Skin is warm and dry. No rash noted.   ?Cardiovascular: Normal heart rate noted  ?Respiratory: Normal respiratory effort, no problems with respiration noted  ?Abdomen: Soft, gravid, appropriate for gestational age. Pain/Pressure: Present     ?Pelvic:  Cervical exam performed Dilation: 2 Effacement (%):  50 Station: 0  ?Extremities: Normal range of motion.     ?Mental Status: Normal mood and affect. Normal behavior. Normal judgment and thought content.  ? ?Assessment  ? ?31 y.o. G2P1001 at [redacted]w[redacted]d by  12/14/2021, by Last Menstrual Period presenting for routine prenatal visit ? ?Plan  ? ?pregnancy 2 Problems (from 05/12/21 to present)   ? ? Problem Noted Resolved  ? Supervision of other normal pregnancy, antepartum 05/12/2021 by Mirna Mires, CNM No  ? Overview Addendum 11/22/2021 11:42 AM by Ellwood Sayers, CNM  ?   ?Nursing Staff Provider  ?Office Location  [facility] Dating  LMP, confrimed w [place]  ?Language  English Anatomy US  Normal anatomy  ?Flu Vaccine  Oct 15, 22 Genetic Screen  NIPS: xy  ?TDaP vaccine   10/09/21 Hgb A1C or  ?GTT Third trimester : 123  ?Covid UTD   LAB RESULTS   ?Rhogam  NA Blood Type B/Positive/-- (10/14 1606) B+  ?Feeding Plan Breast Antibody Negative (10/14 1606)negative  ?Contraception IUD Rubella 2.00 (10/14 1606)immune  ?Circumcision  RPR Non Reactive (01/09 1536) NR  ?Pediatrician  [facility] HBsAg Negative (10/14 1606) negative  ?Support Person Matt HIV Non Reactive (01/09 1536)negative  ?Prenatal Classes Multip  Varicella Immune  ?  GBS  (For PCN allergy, check sensitivities)   ?BTL Consent  PAP nml  ? ? ?  ?  ? ?  ?  ? ?Term labor symptoms and general obstetric precautions including but not limited to vaginal bleeding, contractions, leaking of fluid and fetal movement were reviewed in detail with the patient. ?Please refer to After Visit Summary for other counseling recommendations.  ? ?Return in about 1 week (around 12/08/2021) for ROB. ? ?Carie Caddy, CNM  ?Domingo Pulse, [facility]  ?12/01/21  ?  4:33 PM  ? ? ?

## 2021-12-02 ENCOUNTER — Inpatient Hospital Stay
Admission: EM | Admit: 2021-12-02 | Discharge: 2021-12-03 | DRG: 807 | Disposition: A | Discharge status: Home / Self Care | Payer: BC Managed Care – PPO | Attending: Obstetrics & Gynecology | Admitting: Obstetrics & Gynecology

## 2021-12-02 ENCOUNTER — Inpatient Hospital Stay: Payer: BC Managed Care – PPO | Admitting: Anesthesiology

## 2021-12-02 ENCOUNTER — Other Ambulatory Visit: Payer: Self-pay

## 2021-12-02 ENCOUNTER — Encounter: Payer: Self-pay | Admitting: Obstetrics & Gynecology

## 2021-12-02 DIAGNOSIS — Z3A38 38 weeks gestation of pregnancy: Secondary | ICD-10-CM

## 2021-12-02 DIAGNOSIS — O26893 Other specified pregnancy related conditions, third trimester: Secondary | ICD-10-CM | POA: Diagnosis present

## 2021-12-02 DIAGNOSIS — Z3480 Encounter for supervision of other normal pregnancy, unspecified trimester: Principal | ICD-10-CM

## 2021-12-02 LAB — CBC
HCT: 36.3 % (ref 36.0–46.0)
Hemoglobin: 11.7 g/dL — ABNORMAL LOW (ref 12.0–15.0)
MCH: 27.7 pg (ref 26.0–34.0)
MCHC: 32.2 g/dL (ref 30.0–36.0)
MCV: 85.8 fL (ref 80.0–100.0)
Platelets: 279 10*3/uL (ref 150–400)
RBC: 4.23 MIL/uL (ref 3.87–5.11)
RDW: 14.1 % (ref 11.5–15.5)
WBC: 5.9 10*3/uL (ref 4.0–10.5)
nRBC: 0 % (ref 0.0–0.2)

## 2021-12-02 LAB — TYPE AND SCREEN
ABO/RH(D): B POS
Antibody Screen: NEGATIVE

## 2021-12-02 MED ORDER — DIPHENHYDRAMINE HCL 50 MG/ML IJ SOLN: 12.5000 mg | INTRAMUSCULAR | Status: DC | PRN

## 2021-12-02 MED ORDER — SENNOSIDES-DOCUSATE SODIUM 8.6-50 MG PO TABS
2.0000 | ORAL_TABLET | ORAL | Status: DC
  Administered 2021-12-02: 2 via ORAL
  Filled 2021-12-02: qty 2

## 2021-12-02 MED ORDER — ACETAMINOPHEN 325 MG PO TABS: 650.0000 mg | ORAL_TABLET | ORAL | Status: DC | PRN

## 2021-12-02 MED ORDER — IBUPROFEN 600 MG PO TABS
600.0000 mg | ORAL_TABLET | Freq: Four times a day (QID) | ORAL | Status: DC
  Administered 2021-12-02 – 2021-12-03 (×3): 600 mg via ORAL
  Filled 2021-12-02 (×3): qty 1

## 2021-12-02 MED ORDER — ZOLPIDEM TARTRATE 5 MG PO TABS: 5.0000 mg | ORAL_TABLET | Freq: Every evening | ORAL | Status: DC | PRN

## 2021-12-02 MED ORDER — FENTANYL-BUPIVACAINE-NACL 0.5-0.125-0.9 MG/250ML-% EP SOLN: 12.0000 mL/h | EPIDURAL | Status: DC | PRN

## 2021-12-02 MED ORDER — LACTATED RINGERS IV SOLN
500.0000 mL | Freq: Once | INTRAVENOUS | Status: AC
  Administered 2021-12-02: 500 mL via INTRAVENOUS

## 2021-12-02 MED ORDER — OXYTOCIN BOLUS FROM INFUSION
333.0000 mL | Freq: Once | INTRAVENOUS | Status: AC
  Administered 2021-12-02: 333 mL via INTRAVENOUS

## 2021-12-02 MED ORDER — MISOPROSTOL 200 MCG PO TABS
ORAL_TABLET | ORAL | Status: AC
  Filled 2021-12-02: qty 4

## 2021-12-02 MED ORDER — EPHEDRINE 5 MG/ML INJ: 10.0000 mg | INTRAVENOUS | Status: DC | PRN

## 2021-12-02 MED ORDER — ONDANSETRON HCL 4 MG PO TABS: 4.0000 mg | ORAL_TABLET | ORAL | Status: DC | PRN

## 2021-12-02 MED ORDER — AMMONIA AROMATIC IN INHA
RESPIRATORY_TRACT | Status: AC
  Filled 2021-12-02: qty 10

## 2021-12-02 MED ORDER — WITCH HAZEL-GLYCERIN EX PADS
1.0000 "application " | MEDICATED_PAD | CUTANEOUS | Status: DC | PRN
  Administered 2021-12-02: 1 via TOPICAL
  Filled 2021-12-02 (×2): qty 100

## 2021-12-02 MED ORDER — OXYCODONE-ACETAMINOPHEN 5-325 MG PO TABS: 1.0000 | ORAL_TABLET | ORAL | Status: DC | PRN

## 2021-12-02 MED ORDER — FENTANYL-BUPIVACAINE-NACL 0.5-0.125-0.9 MG/250ML-% EP SOLN
EPIDURAL | Status: AC
  Filled 2021-12-02: qty 250

## 2021-12-02 MED ORDER — SODIUM CHLORIDE 0.9% FLUSH: 3.0000 mL | INTRAVENOUS | Status: DC | PRN

## 2021-12-02 MED ORDER — BUTORPHANOL TARTRATE 1 MG/ML IJ SOLN: 1.0000 mg | INTRAMUSCULAR | Status: DC | PRN

## 2021-12-02 MED ORDER — LACTATED RINGERS IV SOLN
500.0000 mL | INTRAVENOUS | Status: DC | PRN
  Administered 2021-12-02: 500 mL via INTRAVENOUS

## 2021-12-02 MED ORDER — COCONUT OIL OIL
1.0000 "application " | TOPICAL_OIL | Status: DC | PRN
  Filled 2021-12-02: qty 120

## 2021-12-02 MED ORDER — ONDANSETRON HCL 4 MG/2ML IJ SOLN
4.0000 mg | INTRAMUSCULAR | Status: DC | PRN
Start: 2021-12-02 — End: 2021-12-04

## 2021-12-02 MED ORDER — LIDOCAINE-EPINEPHRINE (PF) 1.5 %-1:200000 IJ SOLN
INTRAMUSCULAR | Status: DC | PRN
Start: 2021-12-02 — End: 2021-12-02
  Administered 2021-12-02: 3 mL via PERINEURAL

## 2021-12-02 MED ORDER — OXYTOCIN-SODIUM CHLORIDE 30-0.9 UT/500ML-% IV SOLN
2.5000 [IU]/h | INTRAVENOUS | Status: DC
  Filled 2021-12-02: qty 500

## 2021-12-02 MED ORDER — BENZOCAINE-MENTHOL 20-0.5 % EX AERO
1.0000 "application " | INHALATION_SPRAY | CUTANEOUS | Status: DC | PRN
  Administered 2021-12-02: 1 via TOPICAL
  Filled 2021-12-02 (×2): qty 56

## 2021-12-02 MED ORDER — DIBUCAINE (PERIANAL) 1 % EX OINT: 1.0000 "application " | TOPICAL_OINTMENT | CUTANEOUS | Status: DC | PRN

## 2021-12-02 MED ORDER — SIMETHICONE 80 MG PO CHEW: 80.0000 mg | CHEWABLE_TABLET | ORAL | Status: DC | PRN

## 2021-12-02 MED ORDER — ONDANSETRON HCL 4 MG/2ML IJ SOLN: 4.0000 mg | Freq: Four times a day (QID) | INTRAMUSCULAR | Status: DC | PRN

## 2021-12-02 MED ORDER — OXYCODONE-ACETAMINOPHEN 5-325 MG PO TABS: 2.0000 | ORAL_TABLET | ORAL | Status: DC | PRN

## 2021-12-02 MED ORDER — LIDOCAINE HCL (PF) 1 % IJ SOLN
INTRAMUSCULAR | Status: DC | PRN
  Administered 2021-12-02: 3 mL

## 2021-12-02 MED ORDER — LIDOCAINE HCL (PF) 1 % IJ SOLN
INTRAMUSCULAR | Status: AC
  Filled 2021-12-02: qty 30

## 2021-12-02 MED ORDER — SODIUM CHLORIDE 0.9% FLUSH
3.0000 mL | Freq: Two times a day (BID) | INTRAVENOUS | Status: DC
  Administered 2021-12-02: 3 mL via INTRAVENOUS

## 2021-12-02 MED ORDER — DIPHENHYDRAMINE HCL 25 MG PO CAPS
25.0000 mg | ORAL_CAPSULE | Freq: Four times a day (QID) | ORAL | Status: DC | PRN
Start: 2021-12-02 — End: 2021-12-04

## 2021-12-02 MED ORDER — PHENYLEPHRINE 40 MCG/ML (10ML) SYRINGE FOR IV PUSH (FOR BLOOD PRESSURE SUPPORT): 80.0000 ug | PREFILLED_SYRINGE | INTRAVENOUS | Status: DC | PRN

## 2021-12-02 MED ORDER — OXYTOCIN 10 UNIT/ML IJ SOLN
INTRAMUSCULAR | Status: AC
  Filled 2021-12-02: qty 2

## 2021-12-02 MED ORDER — SODIUM CHLORIDE 0.9 % IV SOLN: 250.0000 mL | INTRAVENOUS | Status: DC | PRN

## 2021-12-02 MED ORDER — LIDOCAINE HCL (PF) 1 % IJ SOLN: 30.0000 mL | INTRAMUSCULAR | Status: DC | PRN

## 2021-12-02 MED ORDER — BUPIVACAINE HCL (PF) 0.25 % IJ SOLN
INTRAMUSCULAR | Status: DC | PRN
Start: 2021-12-02 — End: 2021-12-02
  Administered 2021-12-02: 6 mL via EPIDURAL
  Administered 2021-12-02: 5 mL via EPIDURAL

## 2021-12-02 MED ORDER — LACTATED RINGERS IV SOLN: INTRAVENOUS | Status: DC

## 2021-12-02 NOTE — Progress Notes (Signed)
Pt presented to L/D triage with reported contractions that began last night and have increased in frequency and intensity since last night. Pt reports pain is intermittent, rated 8/10. Pt reports no bleeding or LOF and positive fetal movement. Pt reports no urinary symptoms or recent intercourse.  ?SVE 2/50/0 in office yesterday.  ?Monitors applied and assessing.VSS.  ?

## 2021-12-02 NOTE — H&P (Signed)
Obstetrics Admission History & Physical ?  ?Contractions ? ? ?HPI:  ?31 y.o. G2P1001 @ [redacted]w[redacted]d (12/14/2021, by Last Menstrual Period). Admitted on 12/02/2021:   ?Patient Active Problem List  ? Diagnosis Date Noted  ? Normal labor and delivery 12/02/2021  ? Supervision of other normal pregnancy, antepartum 05/12/2021  ? Underweight 08/04/2020  ? Third degree laceration of perineum, type 3a 04/02/2019  ? Female infertility associated with anovulation 02/27/2018  ? Amenorrhea 02/27/2018  ? Delayed gastric emptying 01/06/2016  ? Family history of thyroid disease in sister 01/06/2016  ? Early satiety 11/02/2014  ?  ? ?Presents for painful ctxs, and no VB or ROM.  ? ?Prenatal care at: at [facility]. Pregnancy complicated by none. ? ?ROS: A review of systems was performed and negative, except as stated in the above HPI. ?PMHx:  ?Past Medical History:  ?Diagnosis Date  ? Gastroparesis   ? History of fasciotomy   ? ?PSHx:  ?Past Surgical History:  ?Procedure Laterality Date  ? ANTERIOR COMPARTMENT DECOMPRESSION    ? ?Medications:  ?Medications Prior to Admission  ?Medication Sig Dispense Refill Last Dose  ? PRENATAL 28-0.8 MG TABS Take by mouth.   12/01/2021  ? ?Allergies: has No Known Allergies. ?OBHx:  ?OB History  ?Gravida Para Term Preterm AB Living  ?2 1 1  0 0 1  ?SAB IAB Ectopic Multiple Live Births  ?0 0 0 0 1  ?  ?# Outcome Date GA Lbr Len/2nd Weight Sex Delivery Anes PTL Lv  ?2 Current           ?1 Term 04/01/19 [redacted]w[redacted]d 05:28 / 01:45 2900 g F Vag-Spont EPI  LIV  ?   Complications: Type 3a perineal laceration  ? ?[id number] except as detailed in HPI.Marland Kitchen  No family history of birth defects. ?Soc Hx: Alcohol: none and Recreational drug use: none ? ?Objective:  ? ?Vitals:  ? 12/02/21 1803 12/02/21 1813  ?BP: 123/82 116/71  ?Pulse: 66 76  ?Resp:    ?Temp:    ?SpO2: 100% 100%  ? ?Constitutional: Well nourished, well developed female in no acute distress.  ?HEENT: normal ?Skin: Warm and dry.  ?Cardiovascular:Regular  rate and rhythm.   ?Extremity: trace to 1+ bilateral pedal edema ?Respiratory: Clear to auscultation bilateral. Normal respiratory effort ?Abdomen: gravid, ND, FHT present, mild tenderness on exam ?Back: no CVAT ?Neuro: DTRs 2+, Cranial nerves grossly intact ?Psych: Alert and Oriented x3. No memory deficits. Normal mood and affect.  ?MS: normal gait, normal bilateral lower extremity ROM/strength/stability. ? ?Pelvic exam: is not limited by body habitus ?EGBUS: within normal limits ?Vagina: within normal limits and with normal mucosa ?Cervix: VTX, 8/90/0 ?AROM CLEAR ?Uterus: Spontaneous uterine activity  ?Adnexa: not evaluated ? ?EFM:FHR: 130 bpm, variability: moderate,  accelerations:  Present,  decelerations:  Absent ?Toco: Frequency: Every 2-3 minutes ? ? ?Perinatal info:  ?Blood type: B positive ?Rubella- Immune ?Varicella -Immune ?TDaP Given during third trimester of this pregnancy ?RPR NR / HIV Neg/ HBsAg Neg  ? ?Assessment & Plan:  ? ?31 y.o. G2P1001 @ [redacted]w[redacted]d, Admitted on 12/02/2021:Labor ?   ?Admit for labor, Observe for cervical change, Fetal Wellbeing Reassuring, and Epidural effective ?AROM clear ? ?Barnett Applebaum, MD, FACOG ?[facility], [employer] ?12/02/2021  7:21 PM ? ?

## 2021-12-02 NOTE — Anesthesia Procedure Notes (Signed)
Epidural ?Patient location during procedure: OB ?Start time: 12/02/2021 5:48 PM ? ?Staffing ?Anesthesiologist: Deno Etienne, MD ?Performed: anesthesiologist  ? ?Preanesthetic Checklist ?Completed: patient identified, IV checked, site marked, risks and benefits discussed, surgical consent, monitors and equipment checked, pre-op evaluation and timeout performed ? ?Epidural ?Patient position: sitting ?Prep: Betadine ?Patient monitoring: heart rate, cardiac monitor, continuous pulse ox and blood pressure ?Approach: midline ?Location: L3-L4 ?Injection technique: LOR saline ? ?Needle:  ?Needle type: Tuohy  ?Needle gauge: 18 G ?Needle length: 9 cm ?Needle insertion depth: 5 cm ?Catheter type: closed end flexible ?Catheter size: 20 Guage ?Catheter at skin depth: 9 cm ?Test dose: negative ? ?Additional Notes ?Reason for block:at surgeon's request and procedure for pain ? ? ? ?

## 2021-12-02 NOTE — Anesthesia Preprocedure Evaluation (Addendum)
Anesthesia Evaluation  ?Patient identified by MRN, date of birth, ID band ?Patient awake ? ? ? ?History of Anesthesia Complications ?Negative for: history of anesthetic complications ? ?Airway ?Mallampati: II ? ?TM Distance: >3 FB ?Neck ROM: Full ? ? ? Dental ? ?(+) Teeth Intact ?  ?Pulmonary ?neg pulmonary ROS,  ?  ? ? ? ? ? ? ? Cardiovascular ?negative cardio ROS ? ? ? ? ?  ?Neuro/Psych ?  ? GI/Hepatic ?Neg liver ROS,   ?Endo/Other  ?negative endocrine ROS ? Renal/GU ?negative Renal ROS  ? ?  ?Musculoskeletal ? ? Abdominal ?  ?Peds ? Hematology ?negative hematology ROS ?(+)   ?Anesthesia Other Findings ?pregnant ? Reproductive/Obstetrics ?(+) Pregnancy ? ?  ? ? ? ? ? ? ? ? ? ? ? ? ? ?  ?  ? ? ? ? ? ? ? ? ?Anesthesia Physical ?Anesthesia Plan ? ?ASA: 2 and emergent ? ?Anesthesia Plan: Epidural  ? ?Post-op Pain Management:   ? ?Induction:  ? ?PONV Risk Score and Plan:  ? ?Airway Management Planned:  ? ?Additional Equipment:  ? ?Intra-op Plan:  ? ?Post-operative Plan:  ? ?Informed Consent: I have reviewed the patients History and Physical, chart, labs and discussed the procedure including the risks, benefits and alternatives for the proposed anesthesia with the patient or authorized representative who has indicated his/her understanding and acceptance.  ? ? ? ? ? ?Plan Discussed with:  ? ?Anesthesia Plan Comments: (OSA score 0)  ? ? ? ? ? ?Anesthesia Quick Evaluation ? ?

## 2021-12-02 NOTE — Discharge Summary (Signed)
? ?  Postpartum Discharge Summary ? ?Date of Service updated3/19/23 ? ?   ?Patient Name: Helen Black ?DOB: October 31, 1990 ?MRN: [id number] ? ?Date of admission: 12/02/2021 ?Delivery date:12/02/2021  ?Delivering provider: Teresa Coombs P  ?Date of discharge: 12/03/2021 ? ?Admitting diagnosis: Normal labor and delivery [O80] ?Intrauterine pregnancy: [redacted]w[redacted]d    ?Secondary diagnosis:  Principal Problem: ?  Normal labor and delivery ? ?Additional problems: none    ?Discharge diagnosis: Term Pregnancy Delivered                                              ?Post partum procedures: none ?Augmentation: AROM ?Complications: None ? ?Hospital course: Onset of Labor With Vaginal Delivery      ?31y.o. yo G2P1001 at 334w2das admitted in Active Labor on 12/02/2021. Patient had an uncomplicated labor course as follows:  ?Membrane Rupture Time/Date: 7:05 PM ,12/02/2021   ?Delivery Method:Vaginal, Spontaneous  ?Episiotomy: None  ?Lacerations:  1st degree  ?Patient had an uncomplicated postpartum course.  She is ambulating, tolerating a regular diet, passing flatus, and urinating well. Patient is discharged home in stable condition on 12/03/21. ? ?Newborn Data: ?Birth date:12/02/2021  ?Birth time:8:05 PM  ?Gender:Female  ?Living status:Living  ?Apgars:8 ,9  ?Weight:2820 g  ? ?Magnesium Sulfate received: No ?BMZ received: No ?Rhophylac:No ?MMR:No ?T-DaP:Given postpartum ?Flu: No ?Transfusion:No ? ?Physical exam  ?Vitals:  ? 12/03/21 0025 12/03/21 0122 12/03/21 0258 12/03/21 [id number]?BP: 112/74 111/84 114/84 107/81  ?Pulse: (!) 55 73 (!) 53 66  ?Resp: 18 18 18    ?Temp: 97.8 ?F (36.6 ?C) 98.3 ?F (36.8 ?C) 97.8 ?F (36.6 ?C) 98 ?F (36.7 ?C)  ?TempSrc: Oral Oral Oral Oral  ?SpO2: 100% 98% 100% 95%  ?Weight:      ?Height:      ? ?General: alert, cooperative, and no distress ?Lochia: appropriate ?Uterine Fundus: firm ?Incision: N/A ?DVT Evaluation: No evidence of DVT seen on physical exam. ?Negative Homan's sign. ?Labs: ?Lab Results  ?Component Value  Date  ? WBC 7.0 12/03/2021  ? HGB 10.1 (L) 12/03/2021  ? HCT 30.9 (L) 12/03/2021  ? MCV 84.2 12/03/2021  ? PLT 231 12/03/2021  ? ?No flowsheet data found. ?Edinburgh Score: ?Edinburgh Postnatal Depression Scale Screening Tool 12/03/2021  ?I have been able to laugh and see the funny side of things. (No Data)  ?I have looked forward with enjoyment to things. -  ?I have blamed myself unnecessarily when things went wrong. -  ?I have been anxious or worried for no good reason. -  ?I have felt scared or panicky for no good reason. -  ?Things have been getting on top of me. -  ?I have been so unhappy that I have had difficulty sleeping. -  ?I have felt sad or miserable. -  ?I have been so unhappy that I have been crying. -  ?The thought of harming myself has occurred to me. -  ?Edinburgh Postnatal Depression Scale Total -  ? ? ? ? ?After visit meds:  ?Allergies as of 12/03/2021   ?No Known Allergies ?  ? ?  ?Medication List  ?  ? ?TAKE these medications   ? ?Prenatal 28-0.8 MG Tabs ?Take by mouth. ?  ? ?  ? ? ? ?Discharge home in stable condition ?Infant Feeding: Breast ?Infant Disposition:home with mother ?Discharge instruction: per After Visit Summary and Postpartum  booklet. ?Activity: Advance as tolerated. Pelvic rest for 6 weeks.  ?Diet: routine diet ?Anticipated Birth Control: POPs ?Postpartum Appointment:6 weeks ?Additional Postpartum F/U:  none ?Future Appointments: ?Future Appointments  ?Date Time Provider Early  ?12/08/2021  1:55 PM Dominic, Nunzio Cobbs, CNM [facility] None  ? ?Follow up Visit: ? Follow-up Information   ? ? Chase. Schedule an appointment as soon as possible for a visit in 6 week(s).   ?Why: For Post Partum Check ?Contact information: ?[address] ?[place] [address] ?[phone] ? ? ?  ?  ? ?  ?  ? ?  ? ? ? ?  ? ?12/03/2021 ?Hoyt Koch, MD ? ? ?

## 2021-12-03 LAB — CBC
HCT: 30.9 % — ABNORMAL LOW (ref 36.0–46.0)
Hemoglobin: 10.1 g/dL — ABNORMAL LOW (ref 12.0–15.0)
MCH: 27.5 pg (ref 26.0–34.0)
MCHC: 32.7 g/dL (ref 30.0–36.0)
MCV: 84.2 fL (ref 80.0–100.0)
Platelets: 231 10*3/uL (ref 150–400)
RBC: 3.67 MIL/uL — ABNORMAL LOW (ref 3.87–5.11)
RDW: 14 % (ref 11.5–15.5)
WBC: 7 10*3/uL (ref 4.0–10.5)
nRBC: 0 % (ref 0.0–0.2)

## 2021-12-03 LAB — RPR: RPR Ser Ql: NONREACTIVE

## 2021-12-03 NOTE — Anesthesia Postprocedure Evaluation (Signed)
Anesthesia Post Note ? ?Patient: Helen Black ? ?Procedure(s) Performed: AN AD HOC LABOR EPIDURAL ? ?Patient location during evaluation: Mother Baby ?Anesthesia Type: Epidural ?Level of consciousness: awake and alert ?Pain management: pain level controlled ?Vital Signs Assessment: post-procedure vital signs reviewed and stable ?Respiratory status: spontaneous breathing, nonlabored ventilation and respiratory function stable ?Cardiovascular status: stable ?Postop Assessment: no headache, no backache and epidural receding ?Anesthetic complications: no ? ? ?No notable events documented. ? ? ?Last Vitals:  ?Vitals:  ? 12/03/21 0258 12/03/21 0907  ?BP: 114/84 107/81  ?Pulse: (!) 53 66  ?Resp: 18   ?Temp: 36.6 ?C 36.7 ?C  ?SpO2: 100% 95%  ?  ?Last Pain:  ?Vitals:  ? 12/03/21 0907  ?TempSrc: Oral  ?PainSc:   ? ? ?  ?  ?  ?  ?  ?  ? ?Corinda Gubler ? ? ? ? ?

## 2021-12-03 NOTE — Progress Notes (Signed)
Patient d/c home with infant. D/c instructions, Rx, and f/u appt given to and reviewed with pt. Pt verbalized understanding. Escorted out by staff.  

## 2021-12-04 ENCOUNTER — Encounter: Payer: Self-pay | Admitting: Obstetrics & Gynecology

## 2021-12-04 ENCOUNTER — Telehealth: Payer: Self-pay

## 2021-12-04 NOTE — Telephone Encounter (Signed)
Patient is scheduled for 01/02/22 at 2:35 pm with [facility] for 6 week PP/ Paraguard

## 2021-12-05 NOTE — Telephone Encounter (Signed)
Noted. Will order to arrive by apt date/time. 

## 2021-12-08 ENCOUNTER — Encounter: Payer: BC Managed Care – PPO | Admitting: Licensed Practical Nurse

## 2021-12-20 NOTE — Telephone Encounter (Signed)
Patient is scheduled for 01/18/22 with MMF

## 2021-12-29 NOTE — Telephone Encounter (Signed)
Noted  

## 2022-01-02 ENCOUNTER — Ambulatory Visit: Payer: BC Managed Care – PPO | Admitting: Obstetrics

## 2022-01-18 ENCOUNTER — Ambulatory Visit: Payer: BC Managed Care – PPO | Admitting: Obstetrics

## 2022-01-23 ENCOUNTER — Ambulatory Visit (INDEPENDENT_AMBULATORY_CARE_PROVIDER_SITE_OTHER): Payer: BC Managed Care – PPO | Admitting: Obstetrics

## 2022-01-23 ENCOUNTER — Encounter: Payer: Self-pay | Admitting: Obstetrics

## 2022-01-23 VITALS — BP 118/70 | Wt 116.0 lb

## 2022-01-23 DIAGNOSIS — Z30011 Encounter for initial prescription of contraceptive pills: Secondary | ICD-10-CM

## 2022-01-23 MED ORDER — NORETHINDRONE 0.35 MG PO TABS: 1.0000 | ORAL_TABLET | Freq: Every day | ORAL | 3 refills | Status: DC

## 2022-01-23 NOTE — Progress Notes (Signed)
?  Postpartum Visit  ?Chief Complaint:  ?Chief Complaint  ?Patient presents with  ? Postpartum Care  ? ? ?History of Present Illness: Patient is a 31 y.o. [id number] presents for postpartum visit.Previously she indicated interest in IUD placement, but today has changed her plan to progestin only pills ? ?Date of delivery: 12/02/2021 ?Type of delivery: Vaginal delivery - Vacuum or forceps assisted  no ?Episiotomy No.  ?Laceration: yes smalll first degree ?Pregnancy or labor problems:  no ?Any problems since the delivery:  no ? ?Newborn Details:  ?SINGLETON :  ?1. Baby's name: female. Birth weight: 2820 ?Maternal Details:  ?Breast Feeding:  yes ?Post partum depression/anxiety noted:  no ?Edinburgh Post-Partum Depression Score:  3  ?Date of last PAP: 05/12/2021  normal  ? ?Past Medical History:  ?Diagnosis Date  ? Gastroparesis   ? History of fasciotomy   ? Normal labor and delivery 12/02/2021  ? Third degree laceration of perineum, type 3a 04/02/2019  ? ? ?Past Surgical History:  ?Procedure Laterality Date  ? ANTERIOR COMPARTMENT DECOMPRESSION    ? ? ?Prior to Admission medications   ?Medication Sig Start Date End Date Taking? Authorizing Provider  ?PRENATAL 28-0.8 MG TABS Take by mouth.    [provider]  ? ? ?No Known Allergies  ? ?Social History  ? ?Socioeconomic History  ? Marital status: Married  ?  Spouse name: Susy Frizzle  ? Number of children: Not on file  ? Years of education: Not on file  ? Highest education level: Not on file  ?Occupational History  ? Not on file  ?Tobacco Use  ? Smoking status: Never  ? Smokeless tobacco: Never  ?Vaping Use  ? Vaping Use: Never used  ?Substance and Sexual Activity  ? Alcohol use: Yes  ?  Comment: occasionally  ? Drug use: No  ? Sexual activity: Yes  ?  Birth control/protection: None  ?Other Topics Concern  ? Not on file  ?Social History Narrative  ? Not on file  ? ?Social Determinants of Health  ? ?Financial Resource Strain: Not on file  ?Food Insecurity: Not on file   ?Transportation Needs: Not on file  ?Physical Activity: Not on file  ?Stress: Not on file  ?Social Connections: Not on file  ?Intimate Partner Violence: Not on file  ? ? ?Family History  ?Problem Relation Age of Onset  ? Hypothyroidism Sister   ? ? ?ROS  ? ?Physical Exam ?Wt 116 lb (52.6 kg)   Breastfeeding Yes   BMI 18.17 kg/m?   ?OBGyn Exam  ? ?Female Chaperone present during breast and/or pelvic exam. ? ?Assessment: 31 y.o. [id number] presenting for 6 week postpartum visit ? ?Plan: ?Problem List Items Addressed This Visit   ?None ?Visit Diagnoses   ? ? Postpartum care following vaginal delivery    -  Primary  ? ?  ? ? ? ?1) Contraception Education given regarding options for contraception, including oral contraceptives. ? ?2)  Pap ?- ASCCP guidelines and rational discussed.  Patient opts for q 3 years screening interval ? ?3) Patient underwent screening for postpartum depression with no concerns noted. ?I have prescribed her one year's worth of Micronor. Her husband plans a vasectomy this year. ? ?4) Follow up 1 year for routine annual exam ?Mirna Mires, CNM  ?01/23/2022 1:22 PM  ? ?01/23/2022 1:22 PM    ?

## 2022-01-25 NOTE — Telephone Encounter (Signed)
Patient decided on OCP's 

## 2022-01-31 ENCOUNTER — Telehealth: Payer: Self-pay

## 2022-01-31 NOTE — Telephone Encounter (Signed)
Patient is calling to check to the status of her FMLA and disability being uploaded to her my chart. Please advise?

## 2022-02-01 NOTE — Telephone Encounter (Signed)
FMLA/DISABILITY forms (2) for [employer] filled out.  Will get signature and process.  LM c HSB to have pt call me.

## 2022-02-02 NOTE — Telephone Encounter (Signed)
Given to Bev late this am.

## 2022-07-10 ENCOUNTER — Ambulatory Visit
Admission: EM | Admit: 2022-07-10 | Discharge: 2022-07-10 | Disposition: A | Discharge status: Home / Self Care | Payer: BC Managed Care – PPO | Attending: Physician Assistant | Admitting: Physician Assistant

## 2022-07-10 ENCOUNTER — Encounter: Payer: Self-pay | Admitting: Emergency Medicine

## 2022-07-10 DIAGNOSIS — J069 Acute upper respiratory infection, unspecified: Secondary | ICD-10-CM | POA: Insufficient documentation

## 2022-07-10 DIAGNOSIS — J029 Acute pharyngitis, unspecified: Secondary | ICD-10-CM | POA: Diagnosis present

## 2022-07-10 DIAGNOSIS — K3184 Gastroparesis: Secondary | ICD-10-CM | POA: Insufficient documentation

## 2022-07-10 DIAGNOSIS — R112 Nausea with vomiting, unspecified: Secondary | ICD-10-CM | POA: Diagnosis present

## 2022-07-10 LAB — GROUP A STREP BY PCR: Group A Strep by PCR: NOT DETECTED

## 2022-07-10 MED ORDER — ONDANSETRON 4 MG PO TBDP: 4.0000 mg | ORAL_TABLET | Freq: Three times a day (TID) | ORAL | 0 refills | Status: AC | PRN

## 2022-07-10 NOTE — ED Triage Notes (Signed)
Pt presents with sore throat x 1 week and vomiting that started this morning,

## 2022-07-10 NOTE — ED Provider Notes (Signed)
[facility]    CSN: [id number] Arrival date & time: 07/10/22  1845      History   Chief Complaint Chief Complaint  Patient presents with   Emesis   Sore Throat    HPI Helen Black is a 31 y.o. female presenting for approximately 1 week history of sore throat, cough, congestion, voice hoarseness.  Patient reports that her symptoms started out with more voice hoarseness and less throat pain but the throat pain has gotten worse.  She also reports that she had numerous episodes of vomiting this morning but has a history of gastroparesis so that is not necessarily uncommon for her.  She says it can happen sometimes when she does get sick.  She reports that she was also exposed to a family member who tested positive for strep recently.  Patient denies any fever, sinus pain, ear pain, chest pain, breathing difficulty, abdominal pain or diarrhea.  No other complaints.  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 Authorizing Provider  norethindrone (MICRONOR) 0.35 MG tablet Take 1 tablet (0.35 mg total) by mouth  daily. 01/23/22  Yes Mirna Mires, CNM  ondansetron (ZOFRAN-ODT) 4 MG disintegrating tablet Take 1 tablet (4 mg total) by mouth every 8 (eight) hours as needed for nausea or vomiting. 07/10/22  Yes Shirlee Latch, PA-C  traZODone (DESYREL) 50 MG tablet Take 1-2 tabs nightly for sleep as needed 06/22/22  Yes [provider]  citalopram (CELEXA) 40 MG tablet Take 40 mg by mouth daily. 06/22/22   [provider]  PRENATAL 28-0.8 MG TABS Take by mouth.    [provider]    Family History Family History  Problem Relation Age of Onset   Hypothyroidism Sister     Social History Social History   Tobacco Use   Smoking status: Never   Smokeless tobacco: Never  Vaping Use   Vaping Use: Never used  Substance Use Topics   Alcohol use: Yes    Comment: occasionally   Drug use: No     Allergies   Patient has no known allergies.   Review of Systems Review of Systems  Constitutional:  Negative for chills, diaphoresis, fatigue and fever.  HENT:  Positive for congestion, sore throat and voice change. Negative for ear pain, rhinorrhea, sinus pressure and sinus pain.   Respiratory:  Positive for cough. Negative for shortness of breath.   Gastrointestinal:  Positive for nausea and vomiting. Negative for abdominal pain and diarrhea.  Musculoskeletal:  Negative for arthralgias and myalgias.  Skin:  Negative for rash.  Neurological:  Negative for weakness and headaches.  Hematological:  Negative for adenopathy.     Physical Exam Triage Vital Signs ED Triage Vitals  Enc Vitals Group     BP      Pulse      Resp      Temp      Temp src      SpO2      Weight      Height      Head Circumference      Peak Flow      Pain Score      Pain Loc      Pain Edu?      Excl. in GC?    No data found.  Updated Vital Signs BP 95/69 (BP Location: Left Arm)   Pulse 78   Temp 98.2 F (36.8 C) (Oral)   Resp 16   SpO2 100%   Breastfeeding No      Physical  Exam Vitals and nursing note reviewed.  Constitutional:      General: She is not in acute distress.    Appearance: Normal appearance. She is well-developed. She is not ill-appearing or toxic-appearing.  HENT:     Head: Normocephalic and atraumatic.     Right Ear: Tympanic membrane, ear canal and external ear normal.     Left Ear: Tympanic membrane, ear canal and external ear normal.     Nose: Congestion present.     Mouth/Throat:     Mouth: Mucous membranes are moist.     Pharynx: Oropharynx is clear. Posterior oropharyngeal erythema present.  Eyes:     General: No scleral icterus.       Right eye: No discharge.        Left eye: No discharge.     Conjunctiva/sclera: Conjunctivae normal.  Cardiovascular:     Rate and Rhythm: Normal rate and regular rhythm.     Heart sounds: Normal heart sounds.  Pulmonary:     Effort: Pulmonary effort is normal. No respiratory distress.     Breath sounds: Normal breath sounds.  Abdominal:     Palpations: Abdomen is soft.     Tenderness: There is no abdominal tenderness.  Musculoskeletal:     Cervical back: Neck supple.  Skin:    General: Skin is dry.  Neurological:     General: No focal deficit present.     Mental Status: She is alert. Mental status is at baseline.     Motor: No weakness.     Gait: Gait normal.  Psychiatric:        Mood and Affect: Mood normal.        Behavior: Behavior normal.        Thought Content: Thought content normal.      UC Treatments / Results  Labs (all labs ordered are listed, but only abnormal results are displayed) Labs Reviewed  GROUP A STREP BY PCR    EKG   Radiology No results found.  Procedures Procedures (including critical care time)  Medications Ordered in UC Medications - No data to display  Initial Impression / Assessment and Plan / UC Course  I have reviewed the triage vital signs and the nursing notes.  Pertinent labs & imaging results that were available during my  care of the  patient were reviewed by me and considered in my medical decision making (see chart for details).   31 year old female presents for sore throat, cough, congestion, voice hoarseness for the past 1 week.  Recent worsening of sore throat and numerous episodes of vomiting.  History of gastroparesis and exposure to strep recently.  Vitals normal and stable.  Patient overall well-appearing.  On exam she does have erythema posterior pharynx and mild nasal congestion.  Chest is clear to auscultation heart regular rate rhythm.  PCR strep test obtained.  Negative.  Reviewed results of strep test with patient. Symptoms consistent with viral URI.  Supportive care encouraged with increasing rest and fluids.  Sent ODT Zofran for nausea and vomiting and encouraged rest, fluids, brat diet.  Ibuprofen and Tylenol as needed for discomfort, throat lozenges, Chloraseptic spray.  Reviewed return precautions.   Final Clinical Impressions(s) / UC Diagnoses   Final diagnoses:  Viral URI with cough  Sore throat  Nausea and vomiting, unspecified vomiting type     Discharge Instructions      URI/COLD SYMPTOMS: Your exam today is consistent with a viral illness. Antibiotics are not indicated at this time. Use medications as directed, including cough syrup, nasal saline, and decongestants. Your symptoms should improve over the next few days and resolve within 7-10 days. Increase rest and fluids. F/u if symptoms worsen or predominate such as sore throat, ear pain, productive cough, shortness of breath, or if you develop high fevers or worsening fatigue over the next several days.       ED Prescriptions     Medication Sig Dispense Auth. Provider   ondansetron (ZOFRAN-ODT) 4 MG disintegrating tablet Take 1 tablet (4 mg total) by mouth every 8 (eight) hours as needed for nausea or vomiting. 15 tablet Gretta Cool      PDMP not reviewed this encounter.   Danton Clap, PA-C 07/10/22 2009

## 2022-07-10 NOTE — Discharge Instructions (Signed)
URI/COLD SYMPTOMS: Your exam today is consistent with a viral illness. Antibiotics are not indicated at this time. Use medications as directed, including cough syrup, nasal saline, and decongestants. Your symptoms should improve over the next few days and resolve within 7-10 days. Increase rest and fluids. F/u if symptoms worsen or predominate such as sore throat, ear pain, productive cough, shortness of breath, or if you develop high fevers or worsening fatigue over the next several days.    
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
## 2022-12-27 ENCOUNTER — Other Ambulatory Visit: Payer: Self-pay | Admitting: Obstetrics

## 2022-12-27 DIAGNOSIS — Z30011 Encounter for initial prescription of contraceptive pills: Secondary | ICD-10-CM

## 2023-01-18 IMAGING — US US OB COMP +14 WK
1 series · 15 of 28 positions shown · non-contrast
Comparison: none

CLINICAL DATA: Second trimester pregnancy for fetal anatomy survey.

EXAM:
OBSTETRICAL ULTRASOUND >14 WKS

[Series 1: us ob comp + 14 wk · 15 of 72 slices shown]
[im 1/72]
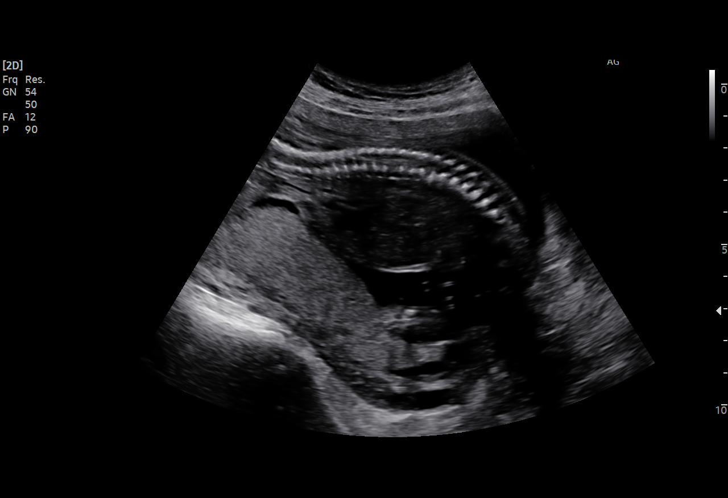
[im 6/72]
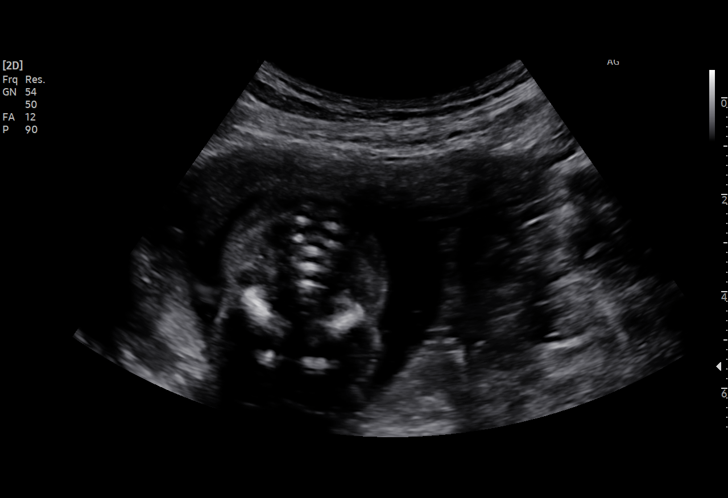
[im 11/72]
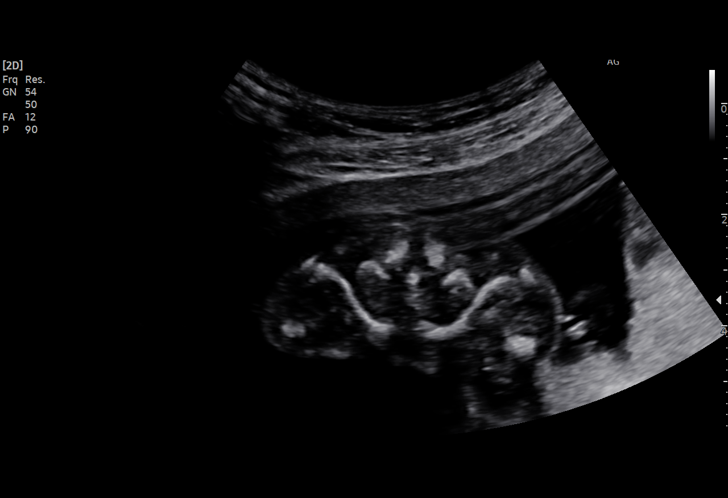
[im 16/72]
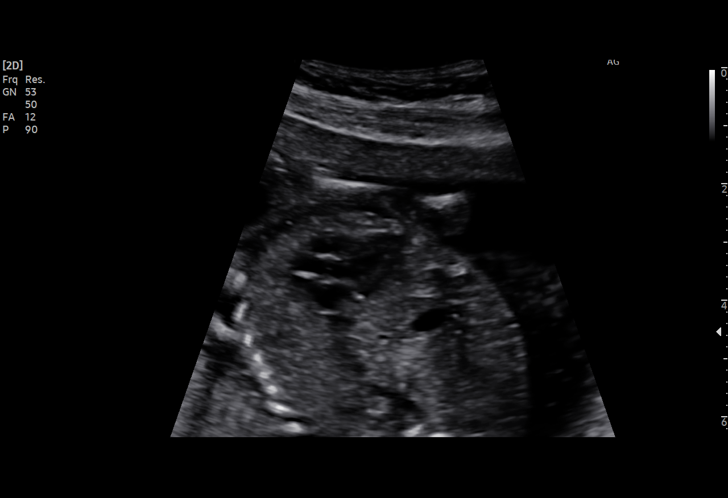
[im 22/72]
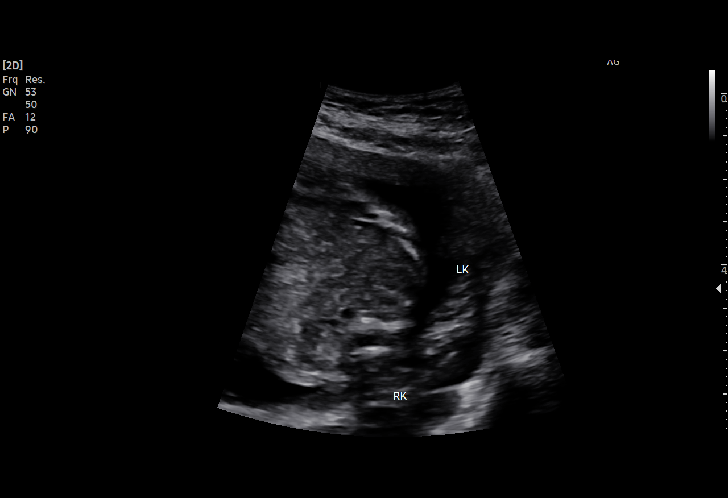
[im 27/72]
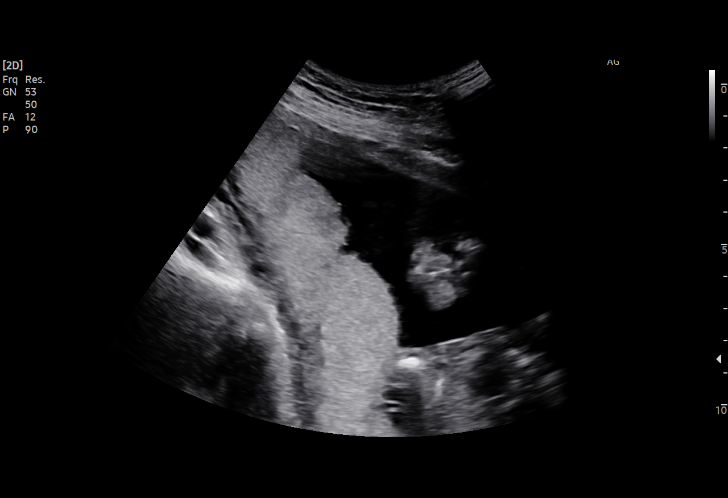
[im 32/72]
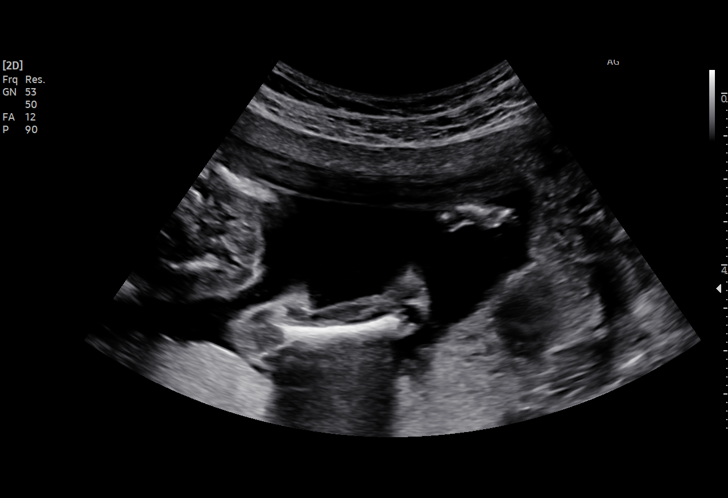
[im 37/72]
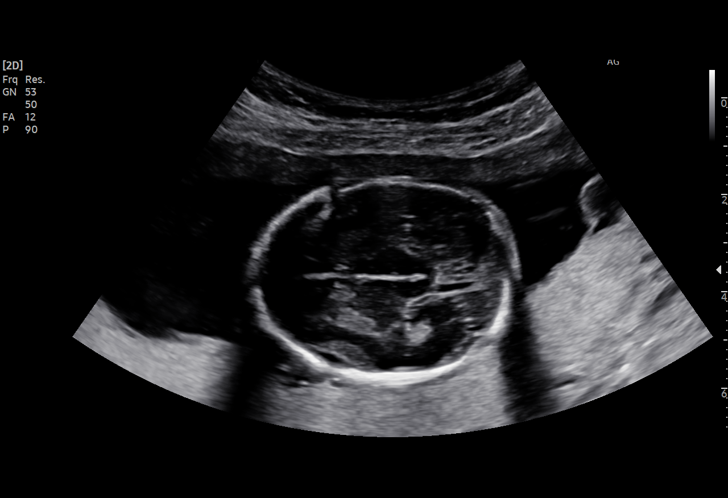
[im 40/72]
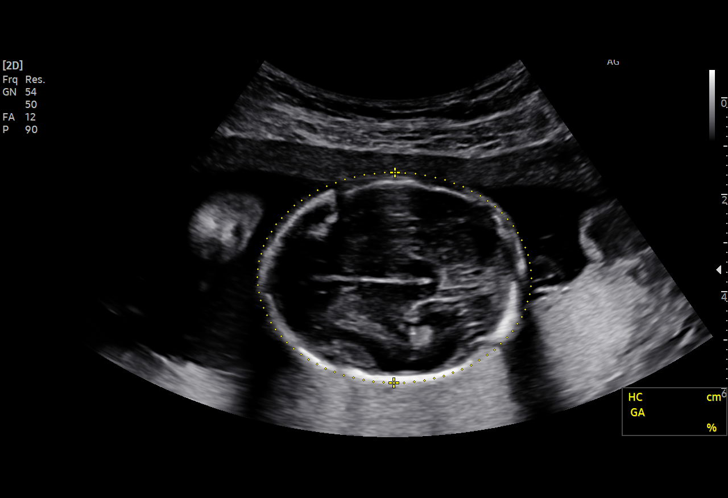
[im 45/72]
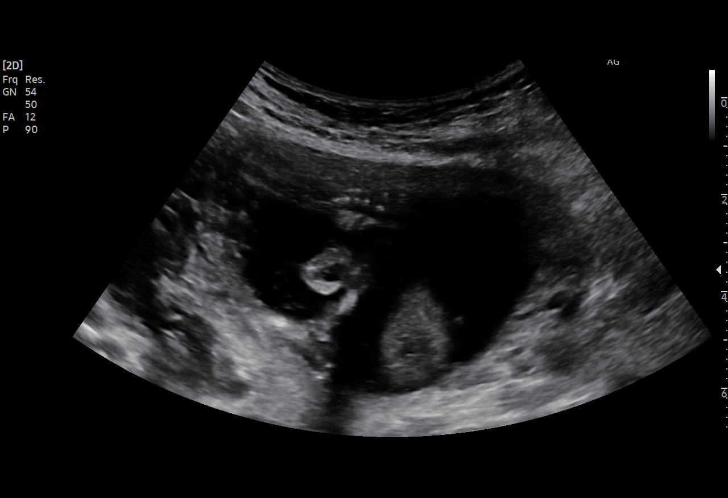
[im 50/72]
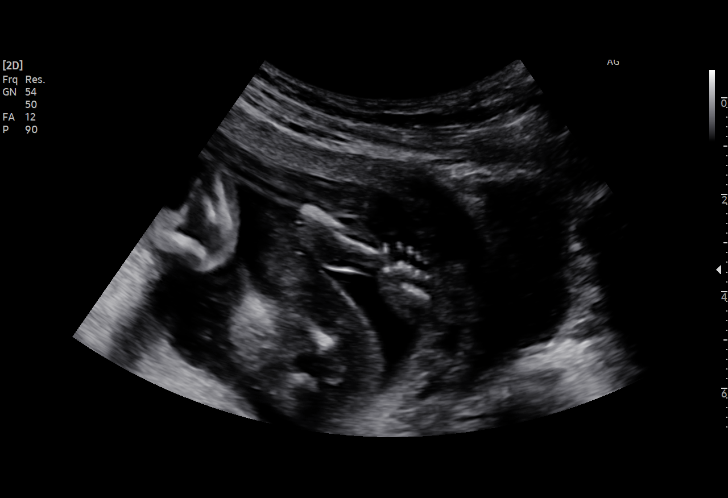
[im 56/72]
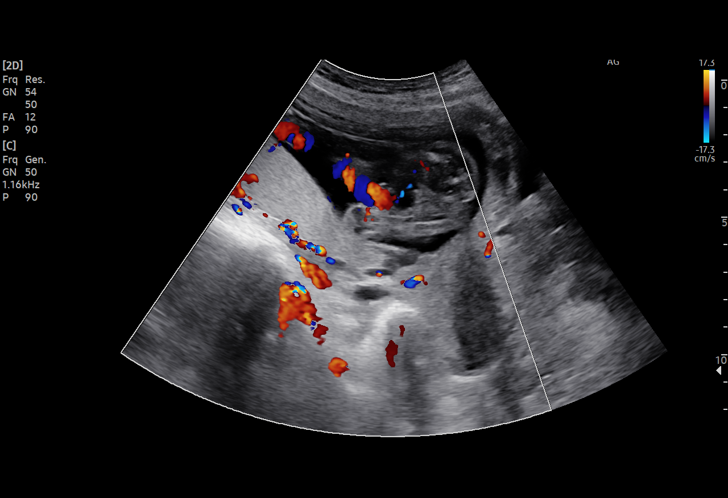
[im 61/72]
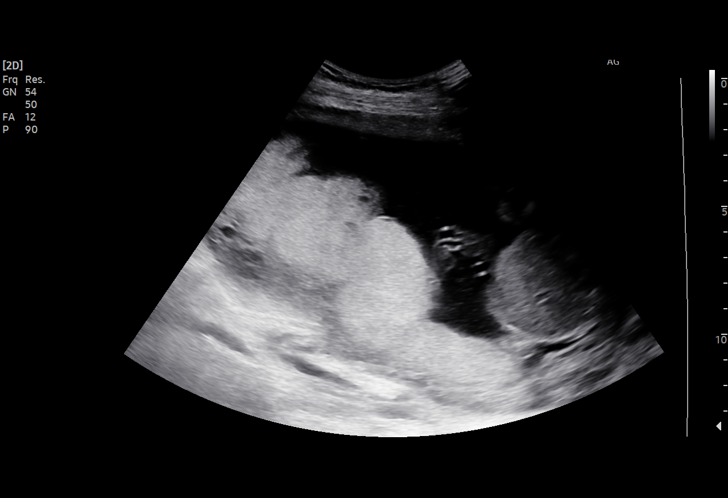
[im 66/72]
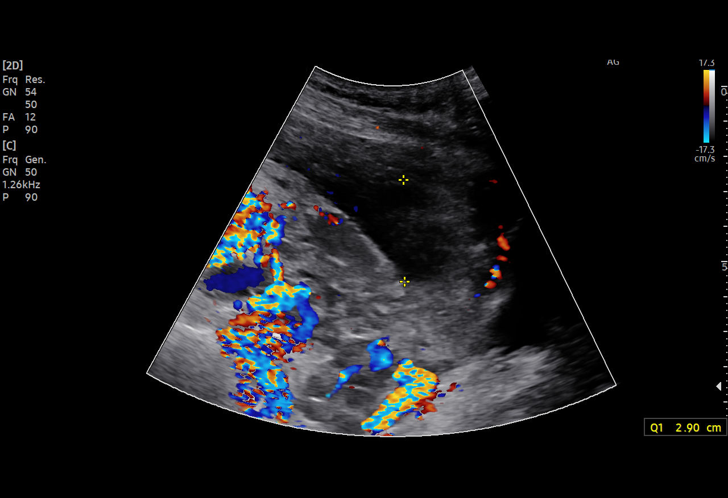
[im 72/72]
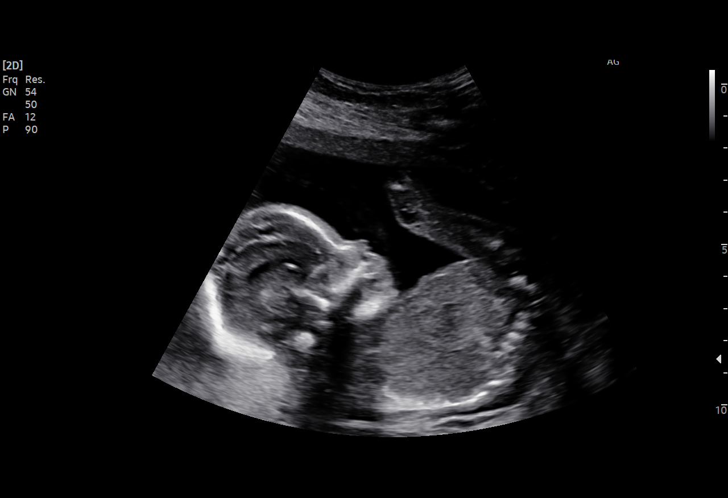

[15 of 28 positions shown; findings below may reference images not displayed]

FINDINGS: Number of Fetuses: 1

Heart Rate:  141 bpm

Movement: Yes

Presentation: Breech

Previa: No

Placental Location: Posterior

Amniotic Fluid (Subjective): Within normal limits

Amniotic Fluid (Objective):

Vertical pocket = 3.9cm

FETAL BIOMETRY

BPD: 4.1cm 18w 2d

HC:   15.8cm 18w 5d

AC:   13.8cm 19w 2d

FL:   2.9cm 18w 6d

Current Mean GA: 18w 5d US EDC: 12/16/2021

FETAL ANATOMY

Lateral Ventricles: Appears normal

Thalami/CSP: Appears normal

Posterior Fossa:  Appears normal

Nuchal Region: Appears normal   NFT= 2.1 mm

Upper Lip: Appears normal

Spine: Distal spine not well visualized

4 Chamber Heart on Left: Appears normal

LVOT: Not visualized

RVOT: Not visualized

Stomach on Left: Appears normal

3 Vessel Cord: Appears normal

Cord Insertion site: Appears normal

Kidneys: Appears normal

Bladder: Appears normal

Extremities: Appears normal

Sex: Male

Technically difficult due to: Fetal position

Maternal Findings:

Cervix:  4.2 cm TA
IMPRESSION: Single living IUP with estimated gestational age of 18 weeks 5 days,
and US EDC of 12/16/2021.

No fetal anomalies identified, although distal spine and cardiac
outflow tracts could not be visualized. Consider followup ultrasound
in 3-4 weeks to complete anatomic evaluation.

## 2023-02-07 ENCOUNTER — Other Ambulatory Visit: Payer: Self-pay

## 2023-02-07 DIAGNOSIS — Z30011 Encounter for initial prescription of contraceptive pills: Secondary | ICD-10-CM

## 2023-03-26 ENCOUNTER — Other Ambulatory Visit: Payer: Self-pay | Admitting: Obstetrics

## 2023-03-26 DIAGNOSIS — Z30011 Encounter for initial prescription of contraceptive pills: Secondary | ICD-10-CM

## 2023-04-08 ENCOUNTER — Ambulatory Visit: Payer: BC Managed Care – PPO | Admitting: Obstetrics

## 2023-04-08 ENCOUNTER — Encounter: Payer: Self-pay | Admitting: Obstetrics

## 2023-04-08 VITALS — BP 104/74 | HR 86 | Wt 116.8 lb

## 2023-04-08 DIAGNOSIS — N939 Abnormal uterine and vaginal bleeding, unspecified: Secondary | ICD-10-CM

## 2023-04-08 NOTE — Progress Notes (Signed)
Obstetrics & Gynecology Office Visit   Chief Complaint:  Chief Complaint  Patient presents with   Vaginal Bleeding    History of Present Illness: Helen Black presents with a c/o  4 weeks of ongoing vaginal bleeding. She has a 44 month old at home and that child was breastfed. Helen Black had not had a period since delivery, and was started on a minipill. She has been on the Micronor and began spotting June 15th. The irregular bleeding continued until July 15th. Of note, her husband has had a vasectomy, and has been successfully deemed completely "treated".   Review of Systems:  Review of Systems  Constitutional: Negative.   HENT: Negative.    Eyes: Negative.   Cardiovascular: Negative.   Gastrointestinal: Negative.   Genitourinary: Negative.   Skin: Negative.   All other systems reviewed and are negative.    Past Medical History:  Past Medical History:  Diagnosis Date   Gastroparesis    History of fasciotomy    Normal labor and delivery 12/02/2021   Third degree laceration of perineum, type 3a 04/02/2019    Past Surgical History:  Past Surgical History:  Procedure Laterality Date   ANTERIOR COMPARTMENT DECOMPRESSION      Gynecologic History: Patient's last menstrual period was 03/02/2023.  Obstetric History: [id number]  Family History:  Family History  Problem Relation Age of Onset   Hypothyroidism Sister     Social History:  Social History   Socioeconomic History   Marital status: Married    Spouse name: Matt   Number of children: Not on file   Years of education: Not on file   Highest education level: Not on file  Occupational History   Not on file  Tobacco Use   Smoking status: Never   Smokeless tobacco: Never  Vaping Use   Vaping status: Never Used  Substance and Sexual Activity   Alcohol use: Yes    Comment: occasionally   Drug use: No   Sexual activity: Yes    Birth control/protection: None  Other Topics Concern   Not on file  Social History  Narrative   Not on file   Social Determinants of Health   Financial Resource Strain: Low Risk  (04/01/2019)   Overall Financial Resource Strain (CARDIA)    Difficulty of Paying Living Expenses: Not hard at all  Food Insecurity: No Food Insecurity (04/01/2019)   Hunger Vital Sign    Worried About Running Out of Food in the Last Year: Never true    Ran Out of Food in the Last Year: Never true  Transportation Needs: No Transportation Needs (06/22/2022)   Received from [facility], [facility]   [facility]    Lack of Transportation (Medical): No    Lack of Transportation (Non-Medical): No  Physical Activity: Insufficiently Active (04/01/2019)   Exercise Vital Sign    Days of Exercise per Week: 3 days    Minutes of Exercise per Session: 20 min  Stress: No Stress Concern Present (04/01/2019)   [employer] of Occupational Health - Occupational Stress Questionnaire    Feeling of Stress : Not at all  Social Connections: Socially Integrated (04/01/2019)   Social Connection and Isolation Panel [NHANES]    Frequency of Communication with Friends and Family: Three times a week    Frequency of Social Gatherings with Friends and Family: Three times a week    Attends Religious Services: 1 to 4 times per year    Active Member of Clubs  or Organizations: Yes    Attends Banker Meetings: Never    Marital Status: Married  Catering manager Violence: Not At Risk (04/01/2019)   Humiliation, Afraid, Rape, and Kick questionnaire    Fear of Current or Ex-Partner: No    Emotionally Abused: No    Physically Abused: No    Sexually Abused: No    Allergies:  Allergies  Allergen Reactions   Wheat Hives    Medications: Prior to Admission medications   Medication Sig Start Date End Date Taking? Authorizing Provider  citalopram (CELEXA) 40 MG tablet Take 40 mg by mouth daily. 06/22/22  Yes [provider]  EPINEPHrine 0.3 mg/0.3 mL IJ SOAJ injection Inject into  the muscle as directed. 03/04/23  Yes [provider]  hydrOXYzine (ATARAX) 25 MG tablet Take by mouth. 03/28/23  Yes [provider]  norethindrone (MICRONOR) 0.35 MG tablet Take 1 tablet by mouth once daily 03/26/23  Yes Mirna Mires, CNM  ondansetron (ZOFRAN-ODT) 4 MG disintegrating tablet Take 1 tablet (4 mg total) by mouth every 8 (eight) hours as needed for nausea or vomiting. 07/10/22  Yes Shirlee Latch, PA-C  traZODone (DESYREL) 150 MG tablet Take 150 mg by mouth at bedtime.   Yes [provider]  PRENATAL 28-0.8 MG TABS Take by mouth. Patient not taking: Reported on 04/08/2023    [provider]    Physical Exam Vitals:  Vitals:   04/08/23 1556  BP: 104/74  Pulse: 86   Patient's last menstrual period was 03/02/2023.  Physical Exam Constitutional:      Appearance: Normal appearance.     Comments: Slim build. 119 lb weight  HENT:     Head: Normocephalic and atraumatic.  Cardiovascular:     Rate and Rhythm: Normal rate and regular rhythm.     Pulses: Normal pulses.     Heart sounds: Normal heart sounds.  Genitourinary:    Comments: Exam deferred. No bleeding today. Musculoskeletal:        General: Normal range of motion.     Cervical back: Normal range of motion and neck supple.  Skin:    General: Skin is warm and dry.  Neurological:     General: No focal deficit present.     Mental Status: She is alert and oriented to person, place, and time.  Psychiatric:        Mood and Affect: Mood normal.        Behavior: Behavior normal.      Assessment: 32 y.o. [id number] with AUB, while on MIcronor  Plan: Problem List Items Addressed This Visit   None  We discussed her not needing contraception with hormonal pills at this point. She opts to discontinue the Micronor.  Should she have irregular bleeding in the future, I have suggested she restart on combination OCPs as a means of regulating her periods. She is due for an annual [facility] exam. She will make an appointment.  Mirna Mires, CNM  04/08/2023 6:12 PM
